# Patient Record
Sex: Female | Born: 1970 | Race: Black or African American | Hispanic: No | Marital: Single | Smoking: Never smoker
Health system: Southern US, Community
[De-identification: ages and names within clinical notes are randomized; demographics above are authoritative.]

## PROBLEM LIST (undated history)

## (undated) DIAGNOSIS — Z789 Other specified health status: Secondary | ICD-10-CM

## (undated) DIAGNOSIS — T4145XA Adverse effect of unspecified anesthetic, initial encounter: Secondary | ICD-10-CM

## (undated) DIAGNOSIS — Z8742 Personal history of other diseases of the female genital tract: Secondary | ICD-10-CM

## (undated) DIAGNOSIS — N83209 Unspecified ovarian cyst, unspecified side: Secondary | ICD-10-CM

## (undated) DIAGNOSIS — T8859XA Other complications of anesthesia, initial encounter: Secondary | ICD-10-CM

## (undated) HISTORY — DX: Unspecified ovarian cyst, unspecified side: N83.209

## (undated) HISTORY — PX: DILATION AND CURETTAGE OF UTERUS: SHX78

## (undated) HISTORY — PX: WISDOM TOOTH EXTRACTION: SHX21

## (undated) HISTORY — DX: Personal history of other diseases of the female genital tract: Z87.42

## (undated) HISTORY — PX: OVARIAN CYST SURGERY: SHX726

---

## 2008-02-01 DIAGNOSIS — E282 Polycystic ovarian syndrome: Secondary | ICD-10-CM | POA: Insufficient documentation

## 2012-02-11 ENCOUNTER — Encounter: Payer: BC Managed Care – PPO | Admitting: Obstetrics and Gynecology

## 2012-02-24 ENCOUNTER — Ambulatory Visit: Payer: BC Managed Care – PPO | Admitting: Obstetrics and Gynecology

## 2012-02-24 ENCOUNTER — Encounter: Payer: Self-pay | Admitting: Obstetrics and Gynecology

## 2012-02-24 VITALS — BP 122/82 | Ht 65.0 in | Wt 197.0 lb

## 2012-02-24 DIAGNOSIS — N926 Irregular menstruation, unspecified: Secondary | ICD-10-CM

## 2012-02-24 DIAGNOSIS — R5383 Other fatigue: Secondary | ICD-10-CM

## 2012-02-24 LAB — LIPID PANEL
HDL: 57 mg/dL (ref >39)
LDL Cholesterol: 130 mg/dL — ABNORMAL HIGH (ref 0–99)
Total CHOL/HDL Ratio: 3.5 Ratio
Triglycerides: 67 mg/dL (ref <150)
VLDL: 13 mg/dL (ref 0–40)

## 2012-02-24 LAB — CBC
HCT: 36.5 % (ref 36.0–46.0)
MCHC: 31.2 g/dL (ref 30.0–36.0)
MCV: 70.7 fL — ABNORMAL LOW (ref 78.0–100.0)
RDW: 17.2 % — ABNORMAL HIGH (ref 11.5–15.5)

## 2012-02-24 LAB — COMPREHENSIVE METABOLIC PANEL
Alkaline Phosphatase: 69 U/L (ref 39–117)
BUN: 17 mg/dL (ref 6–23)
Creat: 0.73 mg/dL (ref 0.50–1.10)
Glucose, Bld: 75 mg/dL (ref 70–99)
Total Bilirubin: 0.7 mg/dL (ref 0.3–1.2)

## 2012-02-24 NOTE — Progress Notes (Signed)
NEW GYNECOLOGIC EXAMINATION  [name] is an 41 y.o. female, No obstetric history on file., who presents to the [address] division of [facility] for a new patient gynecologic examination.   Leonard Schwartz, M.D. 02/24/2012

## 2012-02-24 NOTE — Progress Notes (Signed)
NEW GYNECOLOGIC EXAMINATION  [name] is an 41 y.o. female, G0P0, who presents to the [facility] division of [facility] for a new patient gynecologic examination. She has a history of polycystic ovary syndrome.  She has a history of endometrial polyps.  She has had a dilatation and curettage on 3 occasions.  She is not sexually active.  She does want to have children in the future.  She has a partner.  She has intermittent bleeding.    Pertinent Gynecological History: Patient's last menstrual period was 01/31/2012. Menses: intermittent Menarche: age 69 Contraception: abstinence DES exposure: unknown Blood transfusions: none Sexually transmitted diseases: The patient denies history of sexually transmitted disease. Previous GYN Procedures: DNC  Last mammogram: not applicable Date: not applicable Last pap: normal  History of Abnormal Pap Smears:  No   Obstetrical History:  Vaginal Deliveries at Term:      0 Preterm Vaginal Deliveries:      0 Cesarean Deliveries at Term:  0 Preterm Cesareans:                 0 Miscarriages:                            0 Abortions:                                  0    Past Medical History  Diagnosis Date  . Hypertension   . Ovarian cyst   . History of PCOS     Past Surgical History  Procedure Date  . Ovarian cyst surgery   . Wisdom tooth extraction     Family History  Problem Relation Age of Onset  . Diabetes Father   . Heart Problems Father   . Parkinson's disease Mother   . Diabetes Mother   . Asthma Mother     Social History:  reports that she has never smoked. She has never used smokeless tobacco. She reports that she does not drink alcohol or use illicit drugs.  Allergies:  Allergies  Allergen Reactions  . Wheat Bran     Pt has a wheat sensitivity     Medications: I have reviewed the patient's current medications.  Review of Systems:  See history of present illness and  gynecologic history.  Physical Examination:  Blood pressure 122/82, height 5\' 5"  (1.651 m), weight 197 lb (89.359 kg), last menstrual period 01/31/2012. Body mass index is 32.78 kg/(m^2).  General: alert and no distress Resp: clear to auscultation bilaterally Cardio: regular rate and rhythm, S1, S2 normal, no murmur, click, rub or gallop GI: soft and nontender Extremities: extremities normal, atraumatic, no cyanosis or edema  normal appearance, no masses or tenderness, deferred  External genitalia: normal general appearance Vaginal: normal without tenderness, induration or masses Cervix: normal appearance Adnexa: normal bimanual exam Uterus: normal size shape and consistency  Results for orders placed in visit on 02/24/12 (from the past 48 hour(s))  POCT URINE PREGNANCY     Status: Normal   Collection Time   02/24/12  9:38 AM      Component Value Range Comment   Preg Test, Ur Negative        Assessment:  History of uterine polyps.  Polycystic ovary syndrome  Vaginal spotting  Overweight or obese: Yes   Pelvic relaxation: No  History of low vitamin D.  History of hypertension.  Currently on no medication.   Plan:    pap smear return annually or prn Contraception:abstinence   CBC, comprehensive metabolic panel, TSH, vitamin D, lipid profile  STD screen request: No  The updated Pap smear screening guidelines were discussed with the patient. The patient requested that I obtain a Pap smear: No.  Kegel exercises discussed: No.  Proper diet and regular exercise were reviewed.  Annual mammograms recommended starting at age 32. Proper breast care was discussed.  Screening colonoscopy is recommended beginning at age 53.  Regular health maintenance was reviewed.  Sleep hygiene was discussed.  Adequate calcium and vitamin D intake was emphasized.  Medications Prescribed:  none  Return to Office:  In 1 month for annual exam  Leonard Schwartz,  M.D. 02/24/2012    When did bleeding start: 01/2012 How  Long: for a day stop and spotting between cycles  How often changing pad/tampon: every 45 min/ hour  Bleeding Disorders: no Cramping: yes Contraception: no Fibroids: no Hormone Therapy: no New Medications: no Menopausal Symptoms: no Vag. Discharge: no Abdominal Pain: no Increased Stress: yes Pt stated she has a history of pcos.

## 2012-04-15 ENCOUNTER — Ambulatory Visit (INDEPENDENT_AMBULATORY_CARE_PROVIDER_SITE_OTHER): Payer: BC Managed Care – PPO | Admitting: Obstetrics and Gynecology

## 2012-04-15 ENCOUNTER — Encounter: Payer: Self-pay | Admitting: Obstetrics and Gynecology

## 2012-04-15 VITALS — BP 140/90 | Resp 18 | Ht 65.0 in | Wt 198.0 lb

## 2012-04-15 DIAGNOSIS — Z124 Encounter for screening for malignant neoplasm of cervix: Secondary | ICD-10-CM

## 2012-04-15 DIAGNOSIS — Z01419 Encounter for gynecological examination (general) (routine) without abnormal findings: Secondary | ICD-10-CM

## 2012-04-15 NOTE — Progress Notes (Signed)
ANNUAL GYNECOLOGIC EXAMINATION   Krystal Bauer is a 42 y.o. female, G0P0, who presents for an annual exam. The patient has a history of polycystic ovary syndrome. She is not sexually active. She does plan to marry. She wants to have children. Blood tests were performed that showed a LDL cholesterol of 130. Her hemoglobin was 11.4. She has a history of hypertension.  History   Social History  . Marital Status: Single    Spouse Name: N/A    Number of Children: N/A  . Years of Education: N/A   Social History Main Topics  . Smoking status: Never Smoker   . Smokeless tobacco: Never Used  . Alcohol Use: Yes     Comment: socially   . Drug Use: No  . Sexually Active: Not Currently    Birth Control/ Protection: Abstinence   Other Topics Concern  . None   Social History Narrative  . None    Menstrual cycle:   LMP: Patient's last menstrual period was 03/30/2012.             The following portions of the patient's history were reviewed and updated as appropriate: allergies, current medications, past family history, past medical history, past social history, past surgical history and problem list.  Review of Systems Pertinent items are noted in HPI. Breast:Negative for breast lump,nipple discharge or nipple retraction Gastrointestinal: Negative for abdominal pain, change in bowel habits or rectal bleeding Urinary:negative   Objective:    BP 140/90  Resp 18  Ht 5\' 5"  (1.651 m)  Wt 198 lb (89.812 kg)  BMI 32.95 kg/m2  LMP 03/30/2012    Weight:  Wt Readings from Last 1 Encounters:  04/15/12 198 lb (89.812 kg)          BMI: Body mass index is 32.95 kg/(m^2).  General Appearance: Alert, appropriate appearance for age. No acute distress HEENT: Grossly normal Neck / Thyroid: Supple, no masses, nodes or enlargement Lungs: clear to auscultation bilaterally Back: No CVA tenderness Breast Exam: No masses or nodes.No dimpling, nipple retraction or discharge. Cardiovascular: Regular  rate and rhythm. S1, S2, no murmur Gastrointestinal: Soft, non-tender, no masses or organomegaly  ++++++++++++++++++++++++++++++++++++++++++++++++++++++++  Pelvic Exam: External genitalia: normal general appearance Vaginal: normal without tenderness, induration or masses. Relaxation: No. The patient is very anxious and it is difficult to do a good pelvic exam. Cervix: normal appearance Adnexa: normal bimanual exam Uterus: normal size, shape, and consistency Rectovaginal: normal rectal, no masses  ++++++++++++++++++++++++++++++++++++++++++++++++++++++++  Lymphatic Exam: Non-palpable nodes in neck, clavicular, axillary, or inguinal regions Neurologic: Normal speech, no tremor  Psychiatric: Alert and oriented, appropriate affect.  Assessment:    Normal gyn exam   Overweight or obese: Yes   Pelvic relaxation: No  Hypertension  Polycystic ovary syndrome  Elevated LDL cholesterol  Anemia   Plan:    mammogram pap smear return annually or prn Contraception:abstinence  Iron therapy discussed.  Preconception issues reviewed.  Medications prescribed: none  STD screen request: No   The updated Pap smear screening guidelines were discussed with the patient. The patient requested that I obtain a Pap smear: Yes.  Kegel exercises discussed: No.  Proper diet and regular exercise were reviewed.  Annual mammograms recommended starting at age 62. Proper breast care was discussed.  Screening colonoscopy is recommended beginning at age 35.  Regular health maintenance was reviewed.  Sleep hygiene was discussed.  Adequate calcium and vitamin D intake was emphasized.  Leonard Schwartz M.D.    Regular Periods: yes Mammogram:  last year  Monthly Breast Ex.: no Exercise: no  Tetanus < 10 years: no Seatbelts: yes  NI. Bladder Functn.: yes Abuse at home: no  Daily BM's: yes Stressful Work: no  Healthy Diet: yes Sigmoid-Colonoscopy: Never  Calcium: no Medical problems  this year: Irregular Bleeding , PCOS.    LAST [id number]   Contraception: None  Mammogram:    PCP: NO PCP in [facility]  PMH: No Changes  FMH: No Changes  Last Bone Scan: never

## 2012-04-17 LAB — PAP IG W/ RFLX HPV ASCU

## 2012-05-21 ENCOUNTER — Encounter: Payer: BC Managed Care – PPO | Admitting: Obstetrics and Gynecology

## 2012-06-01 ENCOUNTER — Telehealth: Payer: Self-pay | Admitting: Obstetrics and Gynecology

## 2012-06-01 NOTE — Telephone Encounter (Signed)
lvm for pt to return call   Nayellie Sanseverino, CMA  

## 2012-06-16 ENCOUNTER — Encounter: Payer: Self-pay | Admitting: Obstetrics and Gynecology

## 2012-06-16 ENCOUNTER — Ambulatory Visit: Payer: BC Managed Care – PPO | Admitting: Obstetrics and Gynecology

## 2012-06-16 VITALS — BP 126/88 | Wt 197.0 lb

## 2012-06-16 DIAGNOSIS — N926 Irregular menstruation, unspecified: Secondary | ICD-10-CM

## 2012-06-16 NOTE — Progress Notes (Signed)
When did bleeding start: 06/01/12 How  Long: 7 days How often changing pad/tampon: every hour  Bleeding Disorders: no Cramping: yes Contraception: no  Fibroids: no Hormone Therapy: no New Medications: no Menopausal Symptoms: no Vag. Discharge: no Abdominal Pain: no Increased Stress: no Hx of uterine polyps  BP 126/88  Wt 197 lb (89.359 kg)  BMI 32.78 kg/m2  LMP 06/01/2012 Pt states she had bleeding on and off for the entire month of February.  She has a history of PCOS.  She states she has uterine polyps removed every two years.  Over the past year she has had a twenty pound weight gain  Physical Examination: General appearance - alert, well appearing, and in no distress Chest - clear to auscultation, no wheezes, rales or rhonchi, symmetric air entry Heart - normal rate and regular rhythm Abdomen - soft, nontender, nondistended, no masses or organomegaly Pelvic - normal external genitalia, vulva, vagina, cervix, uterus and adnexa, exam limited by patient.  She was very uncomfortable and states she has a hard time having pelvic exams.  She did consent to it and still remained very tense on the exam.  No vaginal bleeding seen Extremities - peripheral pulses normal, no pedal edema, no clubbing or cyanosis PCOS Irregular vaginal bleeding Pt declined UPT, pelvic and abdominal US She desirs for DR  Stefano Gaul to evaluate her bleeding with a hysteroscopy and remove polyps if they are present.  I will report this to Dr Stefano Gaul

## 2012-06-16 NOTE — Patient Instructions (Signed)

## 2012-06-16 NOTE — Telephone Encounter (Signed)
No return call received. Pt scheduled for apt on 06/16/12  Darien Ramus, CMA

## 2012-08-04 ENCOUNTER — Other Ambulatory Visit: Payer: Self-pay | Admitting: Family Medicine

## 2012-08-04 DIAGNOSIS — Z1231 Encounter for screening mammogram for malignant neoplasm of breast: Secondary | ICD-10-CM

## 2012-11-18 ENCOUNTER — Other Ambulatory Visit: Payer: Self-pay | Admitting: Obstetrics and Gynecology

## 2012-11-26 ENCOUNTER — Encounter (HOSPITAL_COMMUNITY)
Admission: RE | Admit: 2012-11-26 | Discharge: 2012-11-26 | Disposition: A | Discharge status: Home / Self Care | Payer: BC Managed Care – PPO | Source: Ambulatory Visit | Attending: Obstetrics and Gynecology | Admitting: Obstetrics and Gynecology

## 2012-11-26 ENCOUNTER — Encounter (HOSPITAL_COMMUNITY): Payer: Self-pay

## 2012-11-26 DIAGNOSIS — Z01818 Encounter for other preprocedural examination: Secondary | ICD-10-CM | POA: Insufficient documentation

## 2012-11-26 DIAGNOSIS — Z01812 Encounter for preprocedural laboratory examination: Secondary | ICD-10-CM | POA: Insufficient documentation

## 2012-11-26 HISTORY — DX: Adverse effect of unspecified anesthetic, initial encounter: T41.45XA

## 2012-11-26 HISTORY — DX: Other specified health status: Z78.9

## 2012-11-26 HISTORY — DX: Other complications of anesthesia, initial encounter: T88.59XA

## 2012-11-26 LAB — CBC
HCT: 34.1 % — ABNORMAL LOW (ref 36.0–46.0)
MCV: 65 fL — ABNORMAL LOW (ref 78.0–100.0)
Platelets: 244 10*3/uL (ref 150–400)
RBC: 5.25 MIL/uL — ABNORMAL HIGH (ref 3.87–5.11)
RDW: 17.8 % — ABNORMAL HIGH (ref 11.5–15.5)
WBC: 9.7 10*3/uL (ref 4.0–10.5)

## 2012-11-26 NOTE — Patient Instructions (Addendum)
20 Krystal Bauer  11/26/2012   Your procedure is scheduled on:  12/03/12  Enter through the Main Entrance of [facility] at 1PM   Pick up the phone at the desk and dial [phone].   Call this number if you have problems the morning of surgery: [phone]   Remember:   Do not eat food:After Midnight.  Do not drink clear liquids: 4 Hours before arrival.  Take these medicines the morning of surgery with A SIP OF WATER: NA   Do not wear jewelry, make-up or nail polish.  Do not wear lotions, powders, or perfumes. You may wear deodorant.  Do not shave 48 hours prior to surgery.  Do not bring valuables to the hospital.  [facility] is not responsible                  for any belongings or valuables brought to the hospital.  Contacts, dentures or bridgework may not be worn into surgery.  Leave suitcase in the car. After surgery it may be brought to your room.  For patients admitted to the hospital, checkout time is 11:00 AM the day of                discharge.   Patients discharged the day of surgery will not be allowed to drive                   home.  Name and phone number of your driver: [name]  Special Instructions: Shower using CHG 2 nights before surgery and the night before surgery.  If you shower the day of surgery use CHG.  Use special wash - you have one bottle of CHG for all showers.  You should use approximately 1/3 of the bottle for each shower.   Please read over the following fact sheets that you were given: Surgical Site Infection Prevention

## 2012-12-02 NOTE — H&P (Signed)
  Admission History and Physical Exam for a Gynecology Patient  [name] is a 42 y.o. female, G0P0, who presents for hysteroscopy with dilatation and curettage. She has been followed at the [facility] division of [facility]. The patient complains of irregular bleeding.  She has a history of polycystic ovary syndrome. She has a history of anemia. She is anxious and has difficulty tolerating pelvic exams well.  OB History   Grav Para Term Preterm Abortions TAB SAB Ect Mult Living   0               Past Medical History  Diagnosis Date  . Ovarian cyst   . History of PCOS   . Complication of anesthesia     pt feels she has prolonged effects of anes, lethargy and feels 'woozey"  . Medical history non-contributory     No prescriptions prior to admission    Past Surgical History  Procedure Laterality Date  . Ovarian cyst surgery    . Wisdom tooth extraction    . Dilation and curettage of uterus      x3    Allergies  Allergen Reactions  . Wheat Bran     Pt has a wheat sensitivity   . Sulfa Antibiotics Other (See Comments)    Pt can not remember exact reaction; possible rash    Family History: family history includes Asthma in her mother; Diabetes in her father and mother; Heart Problems in her father; Parkinson's disease in her mother.  Social History:  reports that she has never smoked. She has never used smokeless tobacco. She reports that  drinks alcohol. She reports that she does not use illicit drugs.  Review of systems: See HPI.  Admission Physical Exam:    There is no weight on file to calculate BMI.  There were no vitals taken for this visit.  HEENT:                 Within normal limits Chest:                   Clear Heart:                    Regular rate and rhythm Breasts:                No masses, skin changes, bleeding, or discharge present Abdomen:             Nontender, no masses Extremities:           Grossly normal Neurologic exam: Grossly normal  Pelvic exam:  External genitalia: normal general appearance Vaginal: normal without tenderness, induration or masses Cervix: normal appearance Adnexa: normal bimanual exam Uterus: normal size shape and consistency  Assessment:  Irregular uterine bleeding  Polycystic ovary syndrome  Anemia  Anxiety about pelvic examinations  Plan:  The patient will undergo hysteroscopy with dilatation and curettage.  She understands the indications for her procedure.  She accepts the risk of, but not limited to, anesthetic complications, bleeding, infections, and possible damage to surrounding organs.   Janine Limbo 12/02/2012

## 2012-12-03 ENCOUNTER — Encounter (HOSPITAL_COMMUNITY): Payer: Self-pay

## 2012-12-03 ENCOUNTER — Encounter (HOSPITAL_COMMUNITY)
Admission: RE | Disposition: A | Discharge status: Home / Self Care | Payer: Self-pay | Source: Ambulatory Visit | Attending: Obstetrics and Gynecology

## 2012-12-03 ENCOUNTER — Ambulatory Visit (HOSPITAL_COMMUNITY): Payer: BC Managed Care – PPO | Admitting: Anesthesiology

## 2012-12-03 ENCOUNTER — Ambulatory Visit (HOSPITAL_COMMUNITY)
Admission: RE | Admit: 2012-12-03 | Discharge: 2012-12-03 | Disposition: A | Discharge status: Home / Self Care | Payer: BC Managed Care – PPO | Source: Ambulatory Visit | Attending: Obstetrics and Gynecology | Admitting: Obstetrics and Gynecology

## 2012-12-03 ENCOUNTER — Encounter (HOSPITAL_COMMUNITY): Payer: Self-pay | Admitting: Anesthesiology

## 2012-12-03 DIAGNOSIS — N925 Other specified irregular menstruation: Secondary | ICD-10-CM | POA: Insufficient documentation

## 2012-12-03 DIAGNOSIS — E282 Polycystic ovarian syndrome: Secondary | ICD-10-CM | POA: Insufficient documentation

## 2012-12-03 DIAGNOSIS — F411 Generalized anxiety disorder: Secondary | ICD-10-CM | POA: Insufficient documentation

## 2012-12-03 DIAGNOSIS — N938 Other specified abnormal uterine and vaginal bleeding: Secondary | ICD-10-CM | POA: Insufficient documentation

## 2012-12-03 DIAGNOSIS — N949 Unspecified condition associated with female genital organs and menstrual cycle: Secondary | ICD-10-CM | POA: Insufficient documentation

## 2012-12-03 DIAGNOSIS — D500 Iron deficiency anemia secondary to blood loss (chronic): Secondary | ICD-10-CM | POA: Insufficient documentation

## 2012-12-03 DIAGNOSIS — D250 Submucous leiomyoma of uterus: Secondary | ICD-10-CM | POA: Insufficient documentation

## 2012-12-03 DIAGNOSIS — N840 Polyp of corpus uteri: Secondary | ICD-10-CM | POA: Insufficient documentation

## 2012-12-03 HISTORY — PX: DILATATION & CURRETTAGE/HYSTEROSCOPY WITH RESECTOCOPE: SHX5572

## 2012-12-03 LAB — PREGNANCY, URINE: Preg Test, Ur: NEGATIVE

## 2012-12-03 SURGERY — DILATATION & CURETTAGE/HYSTEROSCOPY WITH RESECTOCOPE
Anesthesia: General | Site: Vagina | Wound class: Clean Contaminated

## 2012-12-03 MED ORDER — FENTANYL CITRATE 0.05 MG/ML IJ SOLN: INTRAMUSCULAR | Status: DC | PRN

## 2012-12-03 MED ORDER — MEPERIDINE HCL 25 MG/ML IJ SOLN: 6.2500 mg | INTRAMUSCULAR | Status: DC | PRN

## 2012-12-03 MED ORDER — LIDOCAINE HCL (CARDIAC) 20 MG/ML IV SOLN
INTRAVENOUS | Status: AC
  Filled 2012-12-03: qty 5

## 2012-12-03 MED ORDER — LACTATED RINGERS IV SOLN
INTRAVENOUS | Status: DC
  Administered 2012-12-03 (×2): via INTRAVENOUS

## 2012-12-03 MED ORDER — MIDAZOLAM HCL 5 MG/5ML IJ SOLN: INTRAMUSCULAR | Status: DC | PRN

## 2012-12-03 MED ORDER — BUPIVACAINE-EPINEPHRINE (PF) 0.5% -1:200000 IJ SOLN
INTRAMUSCULAR | Status: AC
  Filled 2012-12-03: qty 10

## 2012-12-03 MED ORDER — SCOPOLAMINE 1 MG/3DAYS TD PT72
1.0000 | MEDICATED_PATCH | TRANSDERMAL | Status: DC
  Administered 2012-12-03: 1.5 mg via TRANSDERMAL

## 2012-12-03 MED ORDER — ONDANSETRON HCL 4 MG/2ML IJ SOLN
INTRAMUSCULAR | Status: AC
  Filled 2012-12-03: qty 2

## 2012-12-03 MED ORDER — DEXAMETHASONE SODIUM PHOSPHATE 4 MG/ML IJ SOLN
INTRAMUSCULAR | Status: DC | PRN
  Administered 2012-12-03: 10 mg via INTRAVENOUS

## 2012-12-03 MED ORDER — SCOPOLAMINE 1 MG/3DAYS TD PT72
MEDICATED_PATCH | TRANSDERMAL | Status: AC
  Administered 2012-12-03: 1.5 mg via TRANSDERMAL
  Filled 2012-12-03: qty 1

## 2012-12-03 MED ORDER — PROPOFOL 10 MG/ML IV BOLUS
INTRAVENOUS | Status: DC | PRN
  Administered 2012-12-03: 150 mg via INTRAVENOUS
  Administered 2012-12-03: 50 mg via INTRAVENOUS

## 2012-12-03 MED ORDER — FENTANYL CITRATE 0.05 MG/ML IJ SOLN
INTRAMUSCULAR | Status: AC
  Filled 2012-12-03: qty 5

## 2012-12-03 MED ORDER — GLYCINE 1.5 % IR SOLN
Status: DC | PRN
  Administered 2012-12-03: 3000 mL

## 2012-12-03 MED ORDER — MIDAZOLAM HCL 5 MG/5ML IJ SOLN
INTRAMUSCULAR | Status: DC | PRN
  Administered 2012-12-03: 1 mg via INTRAVENOUS

## 2012-12-03 MED ORDER — DEXAMETHASONE SODIUM PHOSPHATE 10 MG/ML IJ SOLN
INTRAMUSCULAR | Status: AC
  Filled 2012-12-03: qty 1

## 2012-12-03 MED ORDER — LIDOCAINE HCL (CARDIAC) 20 MG/ML IV SOLN
INTRAVENOUS | Status: DC | PRN
  Administered 2012-12-03: 80 mg via INTRAVENOUS

## 2012-12-03 MED ORDER — OXYCODONE-ACETAMINOPHEN 5-325 MG PO TABS: 1.0000 | ORAL_TABLET | ORAL | Status: DC | PRN

## 2012-12-03 MED ORDER — MIDAZOLAM HCL 2 MG/2ML IJ SOLN
INTRAMUSCULAR | Status: AC
  Filled 2012-12-03: qty 2

## 2012-12-03 MED ORDER — FENTANYL CITRATE 0.05 MG/ML IJ SOLN: 25.0000 ug | INTRAMUSCULAR | Status: DC | PRN

## 2012-12-03 MED ORDER — LIDOCAINE HCL (CARDIAC) 20 MG/ML IV SOLN: INTRAVENOUS | Status: DC | PRN

## 2012-12-03 MED ORDER — PROPOFOL 10 MG/ML IV EMUL
INTRAVENOUS | Status: AC
  Filled 2012-12-03: qty 20

## 2012-12-03 MED ORDER — KETOROLAC TROMETHAMINE 60 MG/2ML IM SOLN
INTRAMUSCULAR | Status: DC | PRN
  Administered 2012-12-03: 30 mg via INTRAMUSCULAR

## 2012-12-03 MED ORDER — KETOROLAC TROMETHAMINE 30 MG/ML IJ SOLN
INTRAMUSCULAR | Status: AC
  Filled 2012-12-03: qty 2

## 2012-12-03 MED ORDER — FENTANYL CITRATE 0.05 MG/ML IJ SOLN
INTRAMUSCULAR | Status: DC | PRN
  Administered 2012-12-03 (×5): 50 ug via INTRAVENOUS

## 2012-12-03 MED ORDER — ONDANSETRON HCL 4 MG/2ML IJ SOLN
INTRAMUSCULAR | Status: DC | PRN
  Administered 2012-12-03: 4 mg via INTRAVENOUS

## 2012-12-03 MED ORDER — BUPIVACAINE-EPINEPHRINE 0.5% -1:200000 IJ SOLN
INTRAMUSCULAR | Status: DC | PRN
  Administered 2012-12-03: 10 mL

## 2012-12-03 MED ORDER — KETOROLAC TROMETHAMINE 30 MG/ML IJ SOLN
INTRAMUSCULAR | Status: DC | PRN
  Administered 2012-12-03: 30 mg via INTRAVENOUS

## 2012-12-03 MED ORDER — METOCLOPRAMIDE HCL 5 MG/ML IJ SOLN: 10.0000 mg | Freq: Once | INTRAMUSCULAR | Status: DC | PRN

## 2012-12-03 MED ORDER — PROMETHAZINE HCL 12.5 MG PO TABS: 12.5000 mg | ORAL_TABLET | Freq: Four times a day (QID) | ORAL | Status: DC | PRN

## 2012-12-03 MED ORDER — IBUPROFEN 800 MG PO TABS: 800.0000 mg | ORAL_TABLET | Freq: Three times a day (TID) | ORAL | Status: DC | PRN

## 2012-12-03 SURGICAL SUPPLY — 16 items
CANISTER SUCTION 2500CC (MISCELLANEOUS) ×2 IMPLANT
CATH ROBINSON RED A/P 16FR (CATHETERS) ×2 IMPLANT
CLOTH BEACON ORANGE TIMEOUT ST (SAFETY) ×2 IMPLANT
CONTAINER PREFILL 10% NBF 60ML (FORM) ×6 IMPLANT
DRESSING TELFA 8X3 (GAUZE/BANDAGES/DRESSINGS) ×2 IMPLANT
ELECT REM PT RETURN 9FT ADLT (ELECTROSURGICAL) ×2
ELECTRODE REM PT RTRN 9FT ADLT (ELECTROSURGICAL) ×1 IMPLANT
GLOVE BIOGEL PI IND STRL 8.5 (GLOVE) ×1 IMPLANT
GLOVE BIOGEL PI INDICATOR 8.5 (GLOVE) ×1
GLOVE ECLIPSE 8.0 STRL XLNG CF (GLOVE) ×4 IMPLANT
GOWN STRL REIN XL XLG (GOWN DISPOSABLE) ×4 IMPLANT
LOOP ANGLED CUTTING 22FR (CUTTING LOOP) ×2 IMPLANT
PACK HYSTEROSCOPY LF (CUSTOM PROCEDURE TRAY) ×2 IMPLANT
PAD OB MATERNITY 4.3X12.25 (PERSONAL CARE ITEMS) ×2 IMPLANT
TOWEL OR 17X24 6PK STRL BLUE (TOWEL DISPOSABLE) ×4 IMPLANT
WATER STERILE IRR 1000ML POUR (IV SOLUTION) ×2 IMPLANT

## 2012-12-03 NOTE — Anesthesia Procedure Notes (Signed)
Procedure Name: LMA Insertion Date/Time: 12/03/2012 2:25 PM Performed by: Graciela Husbands Pre-anesthesia Checklist: Patient identified, Timeout performed, Emergency Drugs available, Patient being monitored and Suction available Patient Re-evaluated:Patient Re-evaluated prior to inductionOxygen Delivery Method: Circle system utilized Preoxygenation: Pre-oxygenation with 100% oxygen Intubation Type: IV induction LMA: LMA inserted LMA Size: 4.0 Number of attempts: 1 Placement Confirmation: positive ETCO2 and breath sounds checked- equal and bilateral Tube secured with: Tape Dental Injury: Teeth and Oropharynx as per pre-operative assessment

## 2012-12-03 NOTE — H&P (Signed)
The patient was interviewed and examined today.  The previously documented history and physical examination was reviewed. There are no changes. The operative procedure was reviewed. The risks and benefits were outlined again. The specific risks include, but are not limited to, anesthetic complications, bleeding, infections, and possible damage to the surrounding organs. The patient's questions were answered.  We are ready to proceed as outlined. The likelihood of the patient achieving the goals of this procedure is very likely.   CBC    Component Value Date/Time   WBC 9.7 11/26/2012 1100   RBC 5.25* 11/26/2012 1100   HGB 10.5* 11/26/2012 1100   HCT 34.1* 11/26/2012 1100   PLT 244 11/26/2012 1100   MCV 65.0* 11/26/2012 1100   MCH 20.0* 11/26/2012 1100   MCHC 30.8 11/26/2012 1100   RDW 17.8* 11/26/2012 1100    BP 124/97  Pulse 71  Temp(Src) 98.2 F (36.8 C) (Oral)  Resp 16  SpO2 99%   Leonard Schwartz, M.D.

## 2012-12-03 NOTE — Anesthesia Postprocedure Evaluation (Signed)
  Anesthesia Post-op Note  Patient: Krystal Bauer  Procedure(s) Performed: Procedure(s): DILATATION & CURETTAGE/HYSTEROSCOPY WITH RESECTION OF ENDOMETRIAL POLYP and fibroid (N/A)  Patient Location: PACU  Anesthesia Type:General  Level of Consciousness: awake, alert  and oriented  Airway and Oxygen Therapy: Patient Spontanous Breathing  Post-op Pain: none  Post-op Assessment: Post-op Vital signs reviewed, Patient's Cardiovascular Status Stable, Respiratory Function Stable, Patent Airway, No signs of Nausea or vomiting and Pain level controlled  Post-op Vital Signs: Reviewed and stable  Complications: No apparent anesthesia complications

## 2012-12-03 NOTE — Anesthesia Preprocedure Evaluation (Signed)
Anesthesia Evaluation  Patient identified by MRN, date of birth, ID band Patient awake    Reviewed: Allergy & Precautions, H&P , NPO status , Patient's Chart, lab work & pertinent test results  History of Anesthesia Complications (+) PONV and PROLONGED EMERGENCE  Airway Mallampati: II TM Distance: >3 FB Neck ROM: Full    Dental no notable dental hx. (+) Teeth Intact   Pulmonary neg pulmonary ROS,  breath sounds clear to auscultation  Pulmonary exam normal       Cardiovascular negative cardio ROS  Rhythm:Regular Rate:Normal     Neuro/Psych negative neurological ROS  negative psych ROS   GI/Hepatic negative GI ROS, Neg liver ROS,   Endo/Other  negative endocrine ROS  Renal/GU negative Renal ROS  negative genitourinary   Musculoskeletal negative musculoskeletal ROS (+)   Abdominal Normal abdominal exam  (+)   Peds  Hematology negative hematology ROS (+)   Anesthesia Other Findings Permanent retainers upper and lower Chipped veneer  Reproductive/Obstetrics Menorrhagia Irregular menses                           Anesthesia Physical Anesthesia Plan  ASA: I  Anesthesia Plan: General   Post-op Pain Management:    Induction: Intravenous  Airway Management Planned: LMA  Additional Equipment:   Intra-op Plan:   Post-operative Plan: Extubation in OR  Informed Consent: I have reviewed the patients History and Physical, chart, labs and discussed the procedure including the risks, benefits and alternatives for the proposed anesthesia with the patient or authorized representative who has indicated his/her understanding and acceptance.   Dental advisory given  Plan Discussed with: CRNA, Anesthesiologist and Surgeon  Anesthesia Plan Comments:         Anesthesia Quick Evaluation

## 2012-12-03 NOTE — Transfer of Care (Signed)
Immediate Anesthesia Transfer of Care Note  Patient: Krystal Bauer  Procedure(s) Performed: Procedure(s): DILATATION & CURETTAGE/HYSTEROSCOPY WITH RESECTION OF ENDOMETRIAL POLYP and fibroid (N/A)  Patient Location: PACU  Anesthesia Type:General  Level of Consciousness: awake  Airway & Oxygen Therapy: Patient Spontanous Breathing and Patient connected to nasal cannula oxygen  Post-op Assessment: Report given to PACU RN and Post -op Vital signs reviewed and stable  Post vital signs: stable  Complications: No apparent anesthesia complications

## 2012-12-03 NOTE — Op Note (Addendum)
OPERATIVE NOTE  Krystal Bauer  DOB:    1971/03/01  MRN:    [id number]  CSN:    [id number]  Date of Surgery:  12/03/2012  Preoperative Diagnosis:  Irregular uterine bleeding  Anxiety about examinations  Anemia  Postoperative Diagnosis:  Irregular uterine bleeding  Anxiety about examinations  Anemia  Endometrial polyp  Submucosal fibroids  Procedure:  Hysteroscopy with resection of endometrial polyps and a submucosal fibroids Dilatation and curettage  Surgeon:  Leonard Schwartz, M.D.  Assistant:  None  Anesthetic:  General  Disposition:  The patient is a 42 y.o.-year-old female who presents with irregular uterine bleeding. She is anxious and has difficulty tolerating pelvic exams. She has a history of anemia and her preoperative hemoglobin is 10.5. She understands the indications for her surgical procedure. She accepts the risk of, but not limited to, anesthetic complications, bleeding, infections, and possible damage to the surrounding organs.  Findings:  On examination under anesthesia the uterus was upper limits normal size. No adnexal masses were appreciated. No parametrial disease was appreciated. The uterus sounded to 9 cm. The patient was noted to have a 1 cm endometrial polyp. She also had 2 submucosal fibroids with the largest measuring 0.5 cm in size.  Procedure:  The patient was taken to the operating room where a general anesthetic was given. The perineum and vagina were prepped with Betadine. The bladder was drained of urine. The patient was sterilely draped. Examination under anesthesia was performed. A paracervical block was placed using 10 cc of half percent Marcaine with epinephrine. An endocervical curettage was performed. The cervix was gently dilated. The diagnostic hysteroscope was inserted and the cavity was carefully inspected. Pictures were taken. Findings included: A 1 cm endometrial polyp and 2 submucosal fibroids with the largest  measuring 0.5 cm in size. Both tubal ostia appeared normal. The diagnostic hysteroscope was removed. The cervix was dilated further. The operative hysteroscope was inserted. The polyp and submucosal fibroids were resected using a single loop. The cavity was then curetted using a sharp curet. The cavity was felt to be clean at the end of our procedure. Hemostasis was adequate. All instruments were removed. The examination was repeated and the uterus was noted to be firm. Sponge, and needle counts were correct. The estimated blood loss for the procedure was 25 cc. The estimated fluid deficit loss 105 cc. The patient was awakened from her anesthetic without difficulty. She was returned to the supine position and and transported to the recovery room in stable condition. The endocervical curettings, endometrial resections, and endometrial curettings were sent to pathology.  Followup instructions:  The patient will return to see Dr. Stefano Gaul in 2 weeks. She was given a copy of the postoperative instructions for patients who've undergone hysteroscopy.  Discharge medications:  Motrin 800 mg every 8 hours as needed for mild to moderate pain. Percocet one tablet every 4 hours as needed for severe pain. Phenergan 12.5 mg every 6 hours as needed for nausea.  Leonard Schwartz, M.D.

## 2012-12-04 ENCOUNTER — Encounter (HOSPITAL_COMMUNITY): Payer: Self-pay | Admitting: Obstetrics and Gynecology

## 2013-07-26 ENCOUNTER — Other Ambulatory Visit: Payer: Self-pay | Admitting: Obstetrics and Gynecology

## 2013-07-26 DIAGNOSIS — Z1231 Encounter for screening mammogram for malignant neoplasm of breast: Secondary | ICD-10-CM

## 2013-08-03 ENCOUNTER — Ambulatory Visit: Payer: BC Managed Care – PPO

## 2013-08-04 ENCOUNTER — Encounter (INDEPENDENT_AMBULATORY_CARE_PROVIDER_SITE_OTHER): Payer: Self-pay

## 2013-08-04 ENCOUNTER — Ambulatory Visit
Admission: RE | Admit: 2013-08-04 | Discharge: 2013-08-04 | Disposition: A | Discharge status: Home / Self Care | Payer: BC Managed Care – PPO | Source: Ambulatory Visit | Attending: Obstetrics and Gynecology | Admitting: Obstetrics and Gynecology

## 2013-08-04 DIAGNOSIS — Z1231 Encounter for screening mammogram for malignant neoplasm of breast: Secondary | ICD-10-CM

## 2014-02-24 ENCOUNTER — Ambulatory Visit (INDEPENDENT_AMBULATORY_CARE_PROVIDER_SITE_OTHER): Payer: BC Managed Care – PPO | Admitting: Family Medicine

## 2014-02-24 ENCOUNTER — Encounter: Payer: Self-pay | Admitting: Family Medicine

## 2014-02-24 VITALS — BP 98/72 | HR 59 | Ht 65.0 in | Wt 204.0 lb

## 2014-02-24 DIAGNOSIS — S134XXA Sprain of ligaments of cervical spine, initial encounter: Secondary | ICD-10-CM | POA: Diagnosis not present

## 2014-02-24 MED ORDER — CYCLOBENZAPRINE HCL 10 MG PO TABS: 10.0000 mg | ORAL_TABLET | Freq: Three times a day (TID) | ORAL | Status: DC | PRN
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
## 2014-02-24 NOTE — Patient Instructions (Signed)
Good to see you Heat before activity and ice after.  Working out is good.  Exercises nightly or after working out.  My medicine Duexis 3 times daily for 6 days Flexeril at night and can take during day if needed See me again in 10-14 days.

## 2014-03-11 ENCOUNTER — Encounter: Payer: Self-pay | Admitting: Family Medicine

## 2014-03-11 ENCOUNTER — Ambulatory Visit (INDEPENDENT_AMBULATORY_CARE_PROVIDER_SITE_OTHER): Payer: BC Managed Care – PPO | Admitting: Family Medicine

## 2014-03-11 VITALS — BP 122/86 | HR 70 | Ht 65.0 in | Wt 203.0 lb

## 2014-03-11 DIAGNOSIS — S134XXD Sprain of ligaments of cervical spine, subsequent encounter: Secondary | ICD-10-CM | POA: Diagnosis not present
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
## 2014-03-11 NOTE — Patient Instructions (Addendum)
It is good to see you Vitamin D 2000 IU daily Turmeric 500mg  twice daily.  Ice is still your friend.  Continue the exercises.  Start a walk-run progression: - I would like you to do line drills (try to keep foot on a line when jogging). - Initially start one minute walking than one minute running for 20 mins in the first week,   then 25 mins during the second week, then 30 mins afterwards.  Once you have reached 30 mins: - Run 2 mins, then walk 1 min. -Then run 3 mins, and walk 1 min. -Then run 4 mins, and walk 1 min. -Then run 5 mins, and walk 1 min. -Slowly build up weekly to running 30 mins nonstop.  If painful at any of the steps, back up one step.  See me agai nafter [place] and bring in 3 day food diary and we can get that extra weight off.

## 2014-03-11 NOTE — Assessment & Plan Note (Signed)
Discussed with patient again. Patient is doing better. Encourage her to continue with the conservative therapy at this time. Discussed that the medicines rather to help. We discussed other over-the-counter medications could be beneficial. Patient will try these different interventions and come back and see me again if pain is not completely resolved in 3 weeks.

## 2014-04-28 ENCOUNTER — Other Ambulatory Visit (HOSPITAL_COMMUNITY): Payer: Self-pay | Admitting: Obstetrics and Gynecology

## 2014-04-28 DIAGNOSIS — N979 Female infertility, unspecified: Secondary | ICD-10-CM

## 2014-05-02 ENCOUNTER — Ambulatory Visit (INDEPENDENT_AMBULATORY_CARE_PROVIDER_SITE_OTHER): Payer: 59 | Admitting: Internal Medicine

## 2014-05-02 ENCOUNTER — Encounter: Payer: Self-pay | Admitting: Internal Medicine

## 2014-05-02 VITALS — BP 140/82 | HR 68 | Temp 98.5°F | Resp 16 | Ht 65.0 in | Wt 205.8 lb

## 2014-05-02 DIAGNOSIS — S134XXD Sprain of ligaments of cervical spine, subsequent encounter: Secondary | ICD-10-CM

## 2014-05-02 DIAGNOSIS — E669 Obesity, unspecified: Secondary | ICD-10-CM | POA: Insufficient documentation

## 2014-05-02 DIAGNOSIS — R21 Rash and other nonspecific skin eruption: Secondary | ICD-10-CM | POA: Insufficient documentation

## 2014-05-02 MED ORDER — KETOCONAZOLE 2 % EX CREA: 1.0000 "application " | TOPICAL_CREAM | Freq: Two times a day (BID) | CUTANEOUS | Status: DC

## 2014-05-02 NOTE — Assessment & Plan Note (Signed)
Resolved at this time and will end the problem.

## 2014-05-02 NOTE — Patient Instructions (Signed)
We do not need to check any blood work today and you are up to date on shots.   Keep up the good work on trying to exercise about 3-4 times per week. Good luck with your procedure tomorrow.   Exercise to Stay Healthy Exercise helps you become and stay healthy. EXERCISE IDEAS AND TIPS Choose exercises that:  You enjoy.  Fit into your day. You do not need to exercise really hard to be healthy. You can do exercises at a slow or medium level and stay healthy. You can:  Stretch before and after working out.  Try yoga, Pilates, or tai chi.  Lift weights.  Walk fast, swim, jog, run, climb stairs, bicycle, dance, or rollerskate.  Take aerobic classes. Exercises that burn about 150 calories:  Running 1  miles in 15 minutes.  Playing volleyball for 45 to 60 minutes.  Washing and waxing a car for 45 to 60 minutes.  Playing touch football for 45 minutes.  Walking 1  miles in 35 minutes.  Pushing a stroller 1  miles in 30 minutes.  Playing basketball for 30 minutes.  Raking leaves for 30 minutes.  Bicycling 5 miles in 30 minutes.  Walking 2 miles in 30 minutes.  Dancing for 30 minutes.  Shoveling snow for 15 minutes.  Swimming laps for 20 minutes.  Walking up stairs for 15 minutes.  Bicycling 4 miles in 15 minutes.  Gardening for 30 to 45 minutes.  Jumping rope for 15 minutes.  Washing windows or floors for 45 to 60 minutes. Document Released: 04/27/2010 Document Revised: 06/17/2011 Document Reviewed: 04/27/2010 [facility] Patient Information 2015 [place], [place]. This information is not intended to replace advice given to you by your health care provider. Make sure you discuss any questions you have with your health care provider.
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
## 2014-05-02 NOTE — Progress Notes (Signed)
Pre visit review using our clinic review tool, if applicable. No additional management support is needed unless otherwise documented below in the visit note. 

## 2014-05-02 NOTE — Assessment & Plan Note (Signed)
Talked with her about exercise and diet as a way to lose weight. She is working on it and will go to the gym after our visit. Last lipid panel 2 years ago and normal so will not repeat today. She does not wish lab work as she is having quite a lot with her infertility work up.

## 2014-05-02 NOTE — Assessment & Plan Note (Signed)
Sent in rx for ketoconazole cream that she can use on her skin rash under her breasts and between.

## 2014-05-03 ENCOUNTER — Ambulatory Visit (HOSPITAL_COMMUNITY)
Admission: RE | Admit: 2014-05-03 | Discharge: 2014-05-03 | Disposition: A | Discharge status: Home / Self Care | Payer: 59 | Source: Ambulatory Visit | Attending: Obstetrics and Gynecology | Admitting: Obstetrics and Gynecology

## 2014-05-03 DIAGNOSIS — N979 Female infertility, unspecified: Secondary | ICD-10-CM | POA: Diagnosis present

## 2014-05-03 DIAGNOSIS — E282 Polycystic ovarian syndrome: Secondary | ICD-10-CM | POA: Insufficient documentation

## 2014-05-03 DIAGNOSIS — D259 Leiomyoma of uterus, unspecified: Secondary | ICD-10-CM | POA: Insufficient documentation

## 2014-05-03 MED ORDER — IOHEXOL 300 MG/ML  SOLN
20.0000 mL | Freq: Once | INTRAMUSCULAR | Status: AC | PRN
  Administered 2014-05-03: 20 mL via INTRAVENOUS

## 2014-07-02 ENCOUNTER — Emergency Department (HOSPITAL_COMMUNITY)
Admission: EM | Admit: 2014-07-02 | Discharge: 2014-07-03 | Disposition: A | Discharge status: Home / Self Care | Payer: 59 | Attending: Emergency Medicine | Admitting: Emergency Medicine

## 2014-07-02 ENCOUNTER — Encounter (HOSPITAL_COMMUNITY): Payer: Self-pay | Admitting: Emergency Medicine

## 2014-07-02 DIAGNOSIS — Z8742 Personal history of other diseases of the female genital tract: Secondary | ICD-10-CM | POA: Insufficient documentation

## 2014-07-02 DIAGNOSIS — N12 Tubulo-interstitial nephritis, not specified as acute or chronic: Secondary | ICD-10-CM | POA: Insufficient documentation

## 2014-07-02 DIAGNOSIS — Z91040 Latex allergy status: Secondary | ICD-10-CM | POA: Insufficient documentation

## 2014-07-02 DIAGNOSIS — Z79899 Other long term (current) drug therapy: Secondary | ICD-10-CM | POA: Insufficient documentation

## 2014-07-02 DIAGNOSIS — M791 Myalgia: Secondary | ICD-10-CM | POA: Diagnosis present

## 2014-07-02 DIAGNOSIS — R000 Tachycardia, unspecified: Secondary | ICD-10-CM | POA: Insufficient documentation

## 2014-07-02 LAB — CBC WITH DIFFERENTIAL/PLATELET
BASOS PCT: 0 % (ref 0–1)
Basophils Absolute: 0 10*3/uL (ref 0.0–0.1)
EOS ABS: 0 10*3/uL (ref 0.0–0.7)
Eosinophils Relative: 0 % (ref 0–5)
HCT: 36.6 % (ref 36.0–46.0)
Hemoglobin: 11.8 g/dL — ABNORMAL LOW (ref 12.0–15.0)
Lymphocytes Relative: 6 % — ABNORMAL LOW (ref 12–46)
Lymphs Abs: 1 10*3/uL (ref 0.7–4.0)
MCH: 23 pg — AB (ref 26.0–34.0)
MCHC: 32.2 g/dL (ref 30.0–36.0)
MCV: 71.5 fL — ABNORMAL LOW (ref 78.0–100.0)
MONOS PCT: 15 % — AB (ref 3–12)
Monocytes Absolute: 2.4 10*3/uL — ABNORMAL HIGH (ref 0.1–1.0)
Neutro Abs: 13.2 10*3/uL — ABNORMAL HIGH (ref 1.7–7.7)
Neutrophils Relative %: 79 % — ABNORMAL HIGH (ref 43–77)
Platelets: 221 10*3/uL (ref 150–400)
RBC: 5.12 MIL/uL — ABNORMAL HIGH (ref 3.87–5.11)
RDW: 16.2 % — ABNORMAL HIGH (ref 11.5–15.5)
WBC: 16.6 10*3/uL — ABNORMAL HIGH (ref 4.0–10.5)

## 2014-07-02 MED ORDER — SODIUM CHLORIDE 0.9 % IV BOLUS (SEPSIS)
1000.0000 mL | Freq: Once | INTRAVENOUS | Status: AC
  Administered 2014-07-02: 1000 mL via INTRAVENOUS

## 2014-07-02 MED ORDER — ACETAMINOPHEN 325 MG PO TABS
ORAL_TABLET | ORAL | Status: AC
  Filled 2014-07-02: qty 1

## 2014-07-02 MED ORDER — ACETAMINOPHEN 325 MG PO TABS
650.0000 mg | ORAL_TABLET | Freq: Four times a day (QID) | ORAL | Status: DC | PRN
  Administered 2014-07-02: 650 mg via ORAL

## 2014-07-02 NOTE — ED Notes (Addendum)
Pt was seen at an urgent care yesterday for evaluation sinus pressure, body aches, and pain with urination.  Pt was prescribed Cipro for UTI- after taking 3 doses of medication pt reports N/V and continued body aches and chills.  Pt also reports having a headache for the past few days, neuro exam normal- pt alert and oriented X 4- denies changes in vision.

## 2014-07-02 NOTE — ED Provider Notes (Signed)
CSN: [id number]     Arrival date & time 07/02/14  2055 History  This chart was scribed for Everlene Balls, MD by Eustaquio Maize, ED Scribe. This patient was seen in room B16C/B16C and the patient's care was started at 11:08 PM.    Chief Complaint  Patient presents with  . Generalized Body Aches   The history is provided by the patient. No language interpreter was used.     HPI Comments: Krystal Bauer is a 44 y.o. female who presents to the Emergency Department complaining of generalized body aches that began 5 days ago. Pt also complains of a headache that she describes as a piercing sensation. She has also been having chills and diaphoresis. Pt has been taking Advil Cold & Sinus without any relief. She went to an Urgent Care in [place] 1 day ago and was diagnosed with a UTI. Pt was prescribed Cipro but she states that her symptoms have gotten worse since. Pt states that she has also been nauseous since beginning the Cipro. Pt reports that she is still having dysuria. She denies irregular vaginal bleeding, vaginal discharge, or any other symptoms.     Past Medical History  Diagnosis Date  . Ovarian cyst   . History of PCOS   . Complication of anesthesia     pt feels she has prolonged effects of anes, lethargy and feels 'woozey"  . Medical history non-contributory    Past Surgical History  Procedure Laterality Date  . Ovarian cyst surgery    . Wisdom tooth extraction    . Dilation and curettage of uterus      x3  . Dilatation & currettage/hysteroscopy with resectocope N/A 12/03/2012    Procedure: DILATATION & CURETTAGE/HYSTEROSCOPY WITH RESECTION OF ENDOMETRIAL POLYP and fibroid;  Surgeon: Ena Dawley, MD;  Location: [facility];  Service: Gynecology;  Laterality: N/A;   Family History  Problem Relation Age of Onset  . Diabetes Father   . Heart Problems Father   . Parkinson's disease Mother   . Diabetes Mother   . Asthma Mother    History  Substance Use Topics  .  Smoking status: Never Smoker   . Smokeless tobacco: Never Used  . Alcohol Use: Yes     Comment: socially    OB History    Gravida Para Term Preterm AB TAB SAB Ectopic Multiple Living   0              Review of Systems  10 Systems reviewed and all are negative for acute change except as noted in the HPI.    Allergies  Wheat bran; Latex; Sulfa antibiotics; and Sulfamethoxazole-trimethoprim  Home Medications   Prior to Admission medications   Medication Sig Start Date End Date Taking? Authorizing Provider  ciprofloxacin (CIPRO) 500 MG tablet Take 500 mg by mouth 2 (two) times daily. 7 day course started 07/01/14   Yes Historical Provider, MD  CRANBERRY PO Take 2 tablets by mouth daily.   Yes Historical Provider, MD  ibuprofen (ADVIL,MOTRIN) 200 MG tablet Take 600 mg by mouth every 6 (six) hours as needed (pain).   Yes Historical Provider, MD  ibuprofen (ADVIL,MOTRIN) 800 MG tablet Take 1 tablet (800 mg total) by mouth every 8 (eight) hours as needed for pain. Patient not taking: Reported on 07/02/2014 12/03/12   Ena Dawley, MD  ketoconazole (NIZORAL) 2 % cream Apply 1 application topically 2 (two) times daily. Patient not taking: Reported on 07/02/2014 05/02/14   Olga Millers, MD  Triage Vitals: BP 150/92 mmHg  Pulse 107  Temp(Src) 101.1 F (38.4 C)  Resp 17  Ht 5\' 5"  (1.651 m)  Wt 206 lb (93.441 kg)  BMI 34.28 kg/m2  SpO2 99%   Physical Exam  Constitutional: She is oriented to person, place, and time. She appears well-developed and well-nourished. No distress.  HENT:  Head: Normocephalic and atraumatic.  Nose: Nose normal.  Mouth/Throat: Oropharynx is clear and moist. No oropharyngeal exudate.  Eyes: Conjunctivae and EOM are normal. Pupils are equal, round, and reactive to light. No scleral icterus.  Neck: Normal range of motion. Neck supple. No JVD present. No tracheal deviation present. No thyromegaly present.  Cardiovascular: Regular rhythm and normal heart  sounds.  Tachycardia present.  Exam reveals no gallop and no friction rub.   No murmur heard. Pulmonary/Chest: Effort normal and breath sounds normal. No respiratory distress. She has no wheezes. She exhibits no tenderness.  Abdominal: Soft. Bowel sounds are normal. She exhibits no distension and no mass. There is tenderness (Minimal suprapubic tenderness to deep palpation. ). There is no rebound and no guarding.  Musculoskeletal: Normal range of motion. She exhibits no edema or tenderness.  Lymphadenopathy:    She has no cervical adenopathy.  Neurological: She is alert and oriented to person, place, and time. No cranial nerve deficit. She exhibits normal muscle tone.  Skin: Skin is warm and dry. No rash noted. No erythema. No pallor.  Nursing note and vitals reviewed.   ED Course  Procedures (including critical care time)  DIAGNOSTIC STUDIES: Oxygen Saturation is 99% on RA, normal by my interpretation.    COORDINATION OF CARE: 11:13 PM-Discussed treatment plan which includes CBC, CMP, Lipase, UA, Urine pregnancy with pt at bedside and pt agreed to plan.   Labs Review Labs Reviewed  CBC WITH DIFFERENTIAL/PLATELET - Abnormal; Notable for the following:    WBC 16.6 (*)    RBC 5.12 (*)    Hemoglobin 11.8 (*)    MCV 71.5 (*)    MCH 23.0 (*)    RDW 16.2 (*)    Neutrophils Relative % 79 (*)    Neutro Abs 13.2 (*)    Lymphocytes Relative 6 (*)    Monocytes Relative 15 (*)    Monocytes Absolute 2.4 (*)    All other components within normal limits  COMPREHENSIVE METABOLIC PANEL - Abnormal; Notable for the following:    Sodium 132 (*)    Potassium 3.1 (*)    Glucose, Bld 132 (*)    Albumin 3.1 (*)    All other components within normal limits  URINALYSIS, ROUTINE W REFLEX MICROSCOPIC - Abnormal; Notable for the following:    Color, Urine AMBER (*)    Hgb urine dipstick LARGE (*)    Ketones, ur 15 (*)    Protein, ur 30 (*)    Leukocytes, UA MODERATE (*)    All other components  within normal limits  URINE MICROSCOPIC-ADD ON - Abnormal; Notable for the following:    Squamous Epithelial / LPF FEW (*)    Bacteria, UA FEW (*)    All other components within normal limits  LIPASE, BLOOD  POC URINE PREG, ED    Imaging Review No results found.   EKG Interpretation None      MDM   Final diagnoses:  None   Patient presents to the ED for fevers, chills, body aches and dysuria after being on cipro for 3 days for being diagnosed with UTI by urgent care.  Here  she is febrile and tachycardic.  She was given tylenol and IVF.  Will obtain labs and repeat UA for evaluation.  She has minimal pain on exam and denies any vaginal complaints.    Urinalysis reveals possible partially treated infection. Correlating with patient stating this feels like a urinary tract infection, having fever and tachycardia will treat as polynephritis. She was given a gram of ceftriaxone emergency department. After fluids and antipyretics, patient's vital signs are now within normal range. Patient is safe for discharge home with Keflex. Return precautions given, she is advised to follow-up with her primary care physician within 3 days for continued management.  I personally performed the services described in this documentation, which was scribed in my presence. The recorded information has been reviewed and is accurate.      Everlene Balls, MD 07/03/14 [phone]

## 2014-07-03 LAB — URINE MICROSCOPIC-ADD ON

## 2014-07-03 LAB — URINALYSIS, ROUTINE W REFLEX MICROSCOPIC
BILIRUBIN URINE: NEGATIVE
Glucose, UA: NEGATIVE mg/dL
Ketones, ur: 15 mg/dL — AB
Nitrite: NEGATIVE
Protein, ur: 30 mg/dL — AB
Specific Gravity, Urine: 1.012 (ref 1.005–1.030)
UROBILINOGEN UA: 1 mg/dL (ref 0.0–1.0)
pH: 6 (ref 5.0–8.0)

## 2014-07-03 LAB — COMPREHENSIVE METABOLIC PANEL
ALT: 21 U/L (ref 0–35)
AST: 25 U/L (ref 0–37)
Albumin: 3.1 g/dL — ABNORMAL LOW (ref 3.5–5.2)
Alkaline Phosphatase: 83 U/L (ref 39–117)
Anion gap: 11 (ref 5–15)
BILIRUBIN TOTAL: 0.9 mg/dL (ref 0.3–1.2)
BUN: 6 mg/dL (ref 6–23)
CHLORIDE: 99 mmol/L (ref 96–112)
CO2: 22 mmol/L (ref 19–32)
Calcium: 8.5 mg/dL (ref 8.4–10.5)
Creatinine, Ser: 0.74 mg/dL (ref 0.50–1.10)
GFR calc Af Amer: >90 mL/min (ref >90)
GFR calc non Af Amer: >90 mL/min (ref >90)
Glucose, Bld: 132 mg/dL — ABNORMAL HIGH (ref 70–99)
Potassium: 3.1 mmol/L — ABNORMAL LOW (ref 3.5–5.1)
SODIUM: 132 mmol/L — AB (ref 135–145)
TOTAL PROTEIN: 7.3 g/dL (ref 6.0–8.3)

## 2014-07-03 LAB — LIPASE, BLOOD: Lipase: 12 U/L (ref 11–59)

## 2014-07-03 MED ORDER — DEXTROSE 5 % IV SOLN
1.0000 g | Freq: Once | INTRAVENOUS | Status: AC
  Administered 2014-07-03: 1 g via INTRAVENOUS
  Filled 2014-07-03: qty 10

## 2014-07-03 MED ORDER — POTASSIUM CHLORIDE CRYS ER 20 MEQ PO TBCR
40.0000 meq | EXTENDED_RELEASE_TABLET | Freq: Once | ORAL | Status: AC
Start: 2014-07-03 — End: 2014-07-03
  Administered 2014-07-03: 40 meq via ORAL
  Filled 2014-07-03: qty 2

## 2014-07-03 MED ORDER — ONDANSETRON 4 MG PO TBDP
4.0000 mg | ORAL_TABLET | Freq: Three times a day (TID) | ORAL | Status: DC | PRN
Start: 2014-07-03 — End: 2019-07-12

## 2014-07-03 MED ORDER — CEPHALEXIN 500 MG PO CAPS: 500.0000 mg | ORAL_CAPSULE | Freq: Two times a day (BID) | ORAL | Status: DC

## 2014-07-03 NOTE — Discharge Instructions (Signed)
Pyelonephritis, Adult Krystal Bauer, your urine shows a mild infection, stop taking ciprofloxacin and begin taking the new antibiotics as prescribed. She primary care physician within 3 days for follow-up. If symptoms worsen come back to emergency department immediately. Thank you. Pyelonephritis is a kidney infection. A kidney infection can happen quickly, or it can last for a long time. HOME CARE   Take your medicine (antibiotics) as told. Finish it even if you start to feel better.  Keep all doctor visits as told.  Drink enough fluids to keep your pee (urine) clear or pale yellow.  Only take medicine as told by your doctor. GET HELP RIGHT AWAY IF:   You have a fever or lasting symptoms for more than 2-3 days.  You have a fever and your symptoms suddenly get worse.  You cannot take your medicine or drink fluids as told.  You have chills and shaking.  You feel very weak or pass out (faint).  You do not feel better after 2 days. MAKE SURE YOU:  Understand these instructions.  Will watch your condition.  Will get help right away if you are not doing well or get worse. Document Released: 05/02/2004 Document Revised: 09/24/2011 Document Reviewed: 09/12/2010 [facility] Patient Information 2015 [place], [place]. This information is not intended to replace advice given to you by your health care provider. Make sure you discuss any questions you have with your health care provider.

## 2014-07-03 NOTE — ED Notes (Signed)
Antibiotic complete.  No s/s of reaction noted.

## 2019-03-15 ENCOUNTER — Other Ambulatory Visit: Payer: Self-pay | Admitting: Obstetrics and Gynecology

## 2019-03-15 DIAGNOSIS — D250 Submucous leiomyoma of uterus: Secondary | ICD-10-CM

## 2019-04-22 ENCOUNTER — Encounter: Payer: Self-pay | Admitting: *Deleted

## 2019-04-22 ENCOUNTER — Other Ambulatory Visit: Payer: Self-pay | Admitting: Interventional Radiology

## 2019-04-22 ENCOUNTER — Ambulatory Visit
Admission: RE | Admit: 2019-04-22 | Discharge: 2019-04-22 | Disposition: A | Discharge status: Home / Self Care | Payer: 59 | Source: Ambulatory Visit | Attending: Obstetrics and Gynecology | Admitting: Obstetrics and Gynecology

## 2019-04-22 ENCOUNTER — Other Ambulatory Visit: Payer: Self-pay

## 2019-04-22 DIAGNOSIS — D250 Submucous leiomyoma of uterus: Secondary | ICD-10-CM

## 2019-04-22 HISTORY — PX: IR RADIOLOGIST EVAL & MGMT: IMG5224

## 2019-04-22 NOTE — Consult Note (Signed)
Chief Complaint: Patient was consulted remotely today (TeleHealth) for symptomatic uterine fibroids at the request of Cousins,Sheronette.    Referring Physician(s): Cousins,Sheronette  History of Present Illness: Krystal Bauer is a 49 y.o. female who was diagnosed approximately 5 years ago with uterine fibroids.  She is having worsening menorrhagia.  She was recently diagnosed as anemic and started on p.o. iron supplementation.  She also describes some fatigue.  She has a history of polycystic ovarian disease for 25 years.  She describes only occasional bulk symptoms.  No previous fibroid therapies or surgeries.  She is G0 P0 with no plans for future pregnancy, having undergone extensive infertility work-up.  She describes some hot flashes but no other significant perimenopausal symptoms.  Her  last menstrual cycle was December 26, with monthly frequency, lasting 5 to 6 days with 3 days of heavy bleeding requiring changing pads every 2 hours.  No interperiod  Bleeding.  Recent Pap smear 02/23/2019 -.  Endometrial biopsy 04/02/2019 -.  Multiple previous D&Cs for endometrial polyps, all benign.  Pelvic ultrasound 03/09/2019 demonstrated multiple uterine fibroids measuring up to 4.6 cm. Past Medical History:  Diagnosis Date  . Complication of anesthesia    pt feels she has prolonged effects of anes, lethargy and feels 'woozey"  . History of PCOS   . Medical history non-contributory   . Ovarian cyst     Past Surgical History:  Procedure Laterality Date  . DILATATION & CURRETTAGE/HYSTEROSCOPY WITH RESECTOCOPE N/A 12/03/2012   Procedure: DILATATION & CURETTAGE/HYSTEROSCOPY WITH RESECTION OF ENDOMETRIAL POLYP and fibroid;  Surgeon: Ena Dawley, MD;  Location: [facility];  Service: Gynecology;  Laterality: N/A;  . DILATION AND CURETTAGE OF UTERUS     x3  . OVARIAN CYST SURGERY    . WISDOM TOOTH EXTRACTION      Allergies: Wheat bran, Latex, Sulfa antibiotics, and  Sulfamethoxazole-trimethoprim  Medications: Prior to Admission medications   Medication Sig Start Date End Date Taking? Authorizing Provider  cephALEXin (KEFLEX) 500 MG capsule Take 1 capsule (500 mg total) by mouth 2 (two) times daily. 07/03/14   Everlene Balls, MD  CRANBERRY PO Take 2 tablets by mouth daily.    [provider]  ibuprofen (ADVIL,MOTRIN) 200 MG tablet Take 600 mg by mouth every 6 (six) hours as needed (pain).    [provider]  ibuprofen (ADVIL,MOTRIN) 800 MG tablet Take 1 tablet (800 mg total) by mouth every 8 (eight) hours as needed for pain. Patient not taking: Reported on 07/02/2014 12/03/12   Ena Dawley, MD  ketoconazole (NIZORAL) 2 % cream Apply 1 application topically 2 (two) times daily. Patient not taking: Reported on 07/02/2014 05/02/14   Hoyt Koch, MD  ondansetron (ZOFRAN ODT) 4 MG disintegrating tablet Take 1 tablet (4 mg total) by mouth every 8 (eight) hours as needed for nausea. 07/03/14   Everlene Balls, MD     Family History  Problem Relation Age of Onset  . Diabetes Father   . Heart Problems Father   . Parkinson's disease Mother   . Diabetes Mother   . Asthma Mother     Social History   Socioeconomic History  . Marital status: Single    Spouse name: Not on file  . Number of children: Not on file  . Years of education: Not on file  . Highest education level: Not on file  Occupational History  . Not on file  Tobacco Use  . Smoking status: Never Smoker  . Smokeless tobacco: Never  Used  Substance and Sexual Activity  . Alcohol use: Yes    Comment: socially   . Drug use: No  . Sexual activity: Not Currently    Birth control/protection: Abstinence  Other Topics Concern  . Not on file  Social History Narrative  . Not on file   Social Determinants of Health   Financial Resource Strain:   . Difficulty of Paying Living Expenses: Not on file  Food Insecurity:   . Worried About Charity fundraiser in the Last Year:  Not on file  . Ran Out of Food in the Last Year: Not on file  Transportation Needs:   . Lack of Transportation (Medical): Not on file  . Lack of Transportation (Non-Medical): Not on file  Physical Activity:   . Days of Exercise per Week: Not on file  . Minutes of Exercise per Session: Not on file  Stress:   . Feeling of Stress : Not on file  Social Connections:   . Frequency of Communication with Friends and Family: Not on file  . Frequency of Social Gatherings with Friends and Family: Not on file  . Attends Religious Services: Not on file  . Active Member of Clubs or Organizations: Not on file  . Attends Archivist Meetings: Not on file  . Marital Status: Not on file    ECOG Status: 1 - Symptomatic but completely ambulatory  Review of Systems  Review of Systems: A 12 point ROS discussed and pertinent positives are indicated in the HPI above.  All other systems are negative.  Physical Exam No direct physical exam was performed (except for noted visual exam findings with Video Visits).     Vital Signs: There were no vitals taken for this visit.  Imaging: No results found.  Labs:  CBC: No results for input(s): WBC, HGB, HCT, PLT in the last 8760 hours.  COAGS: No results for input(s): INR, APTT in the last 8760 hours.  BMP: No results for input(s): NA, K, CL, CO2, GLUCOSE, BUN, CALCIUM, CREATININE, GFRNONAA, GFRAA in the last 8760 hours.  Invalid input(s): CMP  LIVER FUNCTION TESTS: No results for input(s): BILITOT, AST, ALT, ALKPHOS, PROT, ALBUMIN in the last 8760 hours.  TUMOR MARKERS: No results for input(s): AFPTM, CEA, CA199, CHROMGRNA in the last 8760 hours.  Assessment and Plan:  My impression is that this patient's menorrhagia is  likely secondary to uterine fibroids. We spent the majority of the consultation discussing the pathophysiology of uterine leiomyomata, natural history, anticipated  involution post menopause, and treatment options. We  discussed myomectomy, hysterectomy, and uterine fibroid embolization. I described the technique of UFE, anticipated benefits, possible risks and complications including but not limited to bleeding, infection, vessel damage, nontarget embolization, and incomplete symptom relief. We discussed the 90% clinical success rate historically and at our experience with UFE for abnormal bleeding. We discussed the post procedure course and time course of symptom resolution. We discussed the need for continued gynecologic care.  She seemed to understand and did ask appropriate questions, which were answered.  Based on her evaluation thus far, I think she would be an appropriate candidate for uterine fibroid embolization because of her symptomatology and  uterine fibroids. To complete her evaluation and workup, I would require pelvic MRI with contrast to best determine the exact site of location of her uterine fibroids, specifically to exclude any pedunculated fibroids on a narrow stalk, as well as to exclude any unexpected pelvic pathology.   After our discussion,  the patient was motivated proceed. Accordingly, we can set up the MRI   at her convenience as an outpatient. If this looks okay, she would like to proceed with UFE.  We discussed the Covid pandemic and current inability to admit overnight for observation, hopefully which will be alleviated shortly with the vaccine.  She was not interested in day surgery with pain block and home p.o. narcotics.   Thank you for this interesting consult.  I greatly enjoyed meeting Krystal Bauer and look forward to participating in their care.  A copy of this report was sent to the requesting provider on this date.  Electronically Signed: Rickard Rhymes 04/22/2019, 10:55 AM   I spent a total of  40 Minutes   in remote  clinical consultation, greater than 50% of which was counseling/coordinating care for symptomatic uterine fibroids.    Visit type: Audio and  video (WebEx).   Alternative for in-person consultation at [facility], [place] [address] [place], [place], [place]. This visit type was conducted due to national recommendations for restrictions regarding the COVID-19 Pandemic (e.g. social distancing).  This format is felt to be most appropriate for this patient at this time.  All issues noted in this document were discussed and addressed.

## 2019-05-07 ENCOUNTER — Ambulatory Visit (HOSPITAL_COMMUNITY)
Admission: RE | Admit: 2019-05-07 | Discharge: 2019-05-07 | Disposition: A | Discharge status: Home / Self Care | Payer: 59 | Source: Ambulatory Visit | Attending: Interventional Radiology | Admitting: Interventional Radiology

## 2019-05-07 ENCOUNTER — Other Ambulatory Visit: Payer: Self-pay

## 2019-05-07 ENCOUNTER — Encounter (HOSPITAL_COMMUNITY): Payer: Self-pay

## 2019-05-07 DIAGNOSIS — D250 Submucous leiomyoma of uterus: Secondary | ICD-10-CM

## 2019-05-07 MED ORDER — GADOBUTROL 1 MMOL/ML IV SOLN: 10.0000 mL | Freq: Once | INTRAVENOUS | Status: DC | PRN

## 2019-05-17 ENCOUNTER — Other Ambulatory Visit (HOSPITAL_COMMUNITY): Payer: 59

## 2019-06-17 ENCOUNTER — Ambulatory Visit (HOSPITAL_COMMUNITY)
Admission: RE | Admit: 2019-06-17 | Discharge: 2019-06-17 | Disposition: A | Discharge status: Home / Self Care | Payer: 59 | Source: Ambulatory Visit | Attending: Interventional Radiology | Admitting: Interventional Radiology

## 2019-06-17 ENCOUNTER — Other Ambulatory Visit: Payer: Self-pay

## 2019-06-17 DIAGNOSIS — D250 Submucous leiomyoma of uterus: Secondary | ICD-10-CM | POA: Diagnosis not present

## 2019-06-17 MED ORDER — GADOBUTROL 1 MMOL/ML IV SOLN
9.0000 mL | Freq: Once | INTRAVENOUS | Status: AC | PRN
  Administered 2019-06-17: 9 mL via INTRAVENOUS

## 2019-06-21 ENCOUNTER — Other Ambulatory Visit: Payer: Self-pay | Admitting: Interventional Radiology

## 2019-06-21 DIAGNOSIS — D219 Benign neoplasm of connective and other soft tissue, unspecified: Secondary | ICD-10-CM

## 2019-06-25 ENCOUNTER — Ambulatory Visit: Payer: 59 | Attending: Internal Medicine

## 2019-06-25 DIAGNOSIS — Z23 Encounter for immunization: Secondary | ICD-10-CM

## 2019-06-25 NOTE — Progress Notes (Signed)
   Covid-19 Vaccination Clinic  Name:  Krystal Bauer    MRN: [id number] DOB: October 28, 1970  06/25/2019  Krystal Bauer was observed post Covid-19 immunization for 15 minutes without incident. She was provided with Vaccine Information Sheet and instruction to access the V-Safe system.   Krystal Bauer was instructed to call 911 with any severe reactions post vaccine: Marland Kitchen Difficulty breathing  . Swelling of face and throat  . A fast heartbeat  . A bad rash all over body  . Dizziness and weakness   Immunizations Administered    Name Date Dose VIS Date Route   Pfizer COVID-19 Vaccine 06/25/2019  1:52 PM 0.3 mL 03/19/2019 Intramuscular   Manufacturer: [place]   Lot: [id number]   [place]: [id number]

## 2019-07-08 ENCOUNTER — Other Ambulatory Visit (HOSPITAL_COMMUNITY)
Admission: RE | Admit: 2019-07-08 | Discharge: 2019-07-08 | Disposition: A | Discharge status: Home / Self Care | Payer: 59 | Source: Ambulatory Visit | Attending: Interventional Radiology | Admitting: Interventional Radiology

## 2019-07-08 DIAGNOSIS — Z01812 Encounter for preprocedural laboratory examination: Secondary | ICD-10-CM | POA: Insufficient documentation

## 2019-07-08 DIAGNOSIS — Z20822 Contact with and (suspected) exposure to covid-19: Secondary | ICD-10-CM | POA: Diagnosis not present

## 2019-07-08 LAB — SARS CORONAVIRUS 2 (TAT 6-24 HRS): SARS Coronavirus 2: NEGATIVE

## 2019-07-09 ENCOUNTER — Other Ambulatory Visit: Payer: Self-pay | Admitting: Radiology

## 2019-07-12 ENCOUNTER — Encounter (HOSPITAL_COMMUNITY): Payer: Self-pay

## 2019-07-12 ENCOUNTER — Observation Stay (HOSPITAL_COMMUNITY)
Admission: RE | Admit: 2019-07-12 | Discharge: 2019-07-13 | Disposition: A | Discharge status: Home / Self Care | Payer: 59 | Source: Ambulatory Visit | Attending: Interventional Radiology | Admitting: Interventional Radiology

## 2019-07-12 ENCOUNTER — Other Ambulatory Visit: Payer: Self-pay | Admitting: Radiology

## 2019-07-12 ENCOUNTER — Other Ambulatory Visit: Payer: Self-pay

## 2019-07-12 ENCOUNTER — Ambulatory Visit (HOSPITAL_COMMUNITY)
Admission: RE | Admit: 2019-07-12 | Discharge: 2019-07-12 | Disposition: A | Discharge status: Home / Self Care | Payer: 59 | Source: Ambulatory Visit | Attending: Interventional Radiology | Admitting: Interventional Radiology

## 2019-07-12 VITALS — BP 152/90 | HR 90 | Temp 98.9°F | Resp 18 | Ht 64.5 in | Wt 194.0 lb

## 2019-07-12 DIAGNOSIS — Z881 Allergy status to other antibiotic agents status: Secondary | ICD-10-CM | POA: Diagnosis not present

## 2019-07-12 DIAGNOSIS — D259 Leiomyoma of uterus, unspecified: Secondary | ICD-10-CM

## 2019-07-12 DIAGNOSIS — Z91040 Latex allergy status: Secondary | ICD-10-CM | POA: Insufficient documentation

## 2019-07-12 DIAGNOSIS — Z882 Allergy status to sulfonamides status: Secondary | ICD-10-CM | POA: Diagnosis not present

## 2019-07-12 DIAGNOSIS — D219 Benign neoplasm of connective and other soft tissue, unspecified: Principal | ICD-10-CM | POA: Insufficient documentation

## 2019-07-12 HISTORY — PX: IR ANGIOGRAM PELVIS SELECTIVE OR SUPRASELECTIVE: IMG661

## 2019-07-12 HISTORY — PX: IR US GUIDE VASC ACCESS RIGHT: IMG2390

## 2019-07-12 HISTORY — PX: IR EMBO TUMOR ORGAN ISCHEMIA INFARCT INC GUIDE ROADMAPPING: IMG5449

## 2019-07-12 HISTORY — PX: IR ANGIOGRAM SELECTIVE EACH ADDITIONAL VESSEL: IMG667

## 2019-07-12 LAB — HCG, SERUM, QUALITATIVE: Preg, Serum: NEGATIVE

## 2019-07-12 LAB — CBC WITH DIFFERENTIAL/PLATELET
Abs Immature Granulocytes: 0.02 10*3/uL (ref 0.00–0.07)
Basophils Absolute: 0 10*3/uL (ref 0.0–0.1)
Basophils Relative: 0 %
Eosinophils Absolute: 0.1 10*3/uL (ref 0.0–0.5)
Eosinophils Relative: 1 %
HCT: 42.2 % (ref 36.0–46.0)
Hemoglobin: 13.2 g/dL (ref 12.0–15.0)
Immature Granulocytes: 0 %
Lymphocytes Relative: 22 %
Lymphs Abs: 1.6 10*3/uL (ref 0.7–4.0)
MCH: 25.3 pg — ABNORMAL LOW (ref 26.0–34.0)
MCHC: 31.3 g/dL (ref 30.0–36.0)
MCV: 81 fL (ref 80.0–100.0)
Monocytes Absolute: 0.7 10*3/uL (ref 0.1–1.0)
Monocytes Relative: 10 %
Neutro Abs: 4.8 10*3/uL (ref 1.7–7.7)
Neutrophils Relative %: 67 %
Platelets: 192 10*3/uL (ref 150–400)
RBC: 5.21 MIL/uL — ABNORMAL HIGH (ref 3.87–5.11)
RDW: 17.1 % — ABNORMAL HIGH (ref 11.5–15.5)
WBC: 7.1 10*3/uL (ref 4.0–10.5)
nRBC: 0 % (ref 0.0–0.2)

## 2019-07-12 LAB — BASIC METABOLIC PANEL
Anion gap: 11 (ref 5–15)
BUN: 16 mg/dL (ref 6–20)
CO2: 20 mmol/L — ABNORMAL LOW (ref 22–32)
Calcium: 8.7 mg/dL — ABNORMAL LOW (ref 8.9–10.3)
Chloride: 104 mmol/L (ref 98–111)
Creatinine, Ser: 0.58 mg/dL (ref 0.44–1.00)
GFR calc Af Amer: >60 mL/min (ref >60)
GFR calc non Af Amer: >60 mL/min (ref >60)
Glucose, Bld: 73 mg/dL (ref 70–99)
Potassium: 4.4 mmol/L (ref 3.5–5.1)
Sodium: 135 mmol/L (ref 135–145)

## 2019-07-12 LAB — PROTIME-INR
INR: 1 (ref 0.8–1.2)
Prothrombin Time: 12.7 seconds (ref 11.4–15.2)

## 2019-07-12 LAB — TYPE AND SCREEN
ABO/RH(D): O POS
Antibody Screen: NEGATIVE

## 2019-07-12 LAB — ABO/RH: ABO/RH(D): O POS

## 2019-07-12 MED ORDER — IOHEXOL 300 MG/ML  SOLN
100.0000 mL | Freq: Once | INTRAMUSCULAR | Status: AC | PRN
  Administered 2019-07-12: 20 mL via INTRA_ARTERIAL

## 2019-07-12 MED ORDER — SODIUM CHLORIDE 0.9 % IV SOLN: 250.0000 mL | INTRAVENOUS | Status: DC | PRN

## 2019-07-12 MED ORDER — ONDANSETRON HCL 4 MG/2ML IJ SOLN: 4.0000 mg | Freq: Four times a day (QID) | INTRAMUSCULAR | Status: DC | PRN

## 2019-07-12 MED ORDER — CEFAZOLIN SODIUM-DEXTROSE 2-4 GM/100ML-% IV SOLN: 2.0000 g | INTRAVENOUS | Status: AC

## 2019-07-12 MED ORDER — SODIUM CHLORIDE 0.9 % IV SOLN: INTRAVENOUS | Status: DC

## 2019-07-12 MED ORDER — FERROUS SULFATE 325 (65 FE) MG PO TABS
325.0000 mg | ORAL_TABLET | Freq: Every day | ORAL | Status: DC
  Administered 2019-07-13: 325 mg via ORAL
  Filled 2019-07-12: qty 1

## 2019-07-12 MED ORDER — SODIUM CHLORIDE 0.9% FLUSH
3.0000 mL | Freq: Two times a day (BID) | INTRAVENOUS | Status: DC
  Administered 2019-07-12: 3 mL via INTRAVENOUS

## 2019-07-12 MED ORDER — SODIUM CHLORIDE 0.9% FLUSH: 9.0000 mL | INTRAVENOUS | Status: DC | PRN

## 2019-07-12 MED ORDER — SODIUM CHLORIDE 0.9% FLUSH: 3.0000 mL | INTRAVENOUS | Status: DC | PRN

## 2019-07-12 MED ORDER — MIDAZOLAM HCL 2 MG/2ML IJ SOLN
INTRAMUSCULAR | Status: AC | PRN
  Administered 2019-07-12 (×4): 1 mg via INTRAVENOUS

## 2019-07-12 MED ORDER — LIDOCAINE HCL (PF) 1 % IJ SOLN
INTRAMUSCULAR | Status: AC | PRN
  Administered 2019-07-12: 5 mL via INTRADERMAL

## 2019-07-12 MED ORDER — HYDROMORPHONE 1 MG/ML IV SOLN
INTRAVENOUS | Status: DC
  Administered 2019-07-12: 1.7 mg via INTRAVENOUS
  Administered 2019-07-12: 30 mg via INTRAVENOUS
  Administered 2019-07-12: 0.3 mg via INTRAVENOUS
  Administered 2019-07-13: 1.3 mg via INTRAVENOUS
  Administered 2019-07-13: 1.2 mg via INTRAVENOUS
  Administered 2019-07-13: 0.9 mg via INTRAVENOUS
  Filled 2019-07-12 (×11): qty 30

## 2019-07-12 MED ORDER — MIDAZOLAM HCL 2 MG/2ML IJ SOLN
INTRAMUSCULAR | Status: AC
  Administered 2019-07-12: 2 mg
  Filled 2019-07-12: qty 6

## 2019-07-12 MED ORDER — IOHEXOL 300 MG/ML  SOLN
100.0000 mL | Freq: Once | INTRAMUSCULAR | Status: AC | PRN
  Administered 2019-07-12: 70 mL via INTRA_ARTERIAL

## 2019-07-12 MED ORDER — PROMETHAZINE HCL 25 MG PO TABS: 25.0000 mg | ORAL_TABLET | Freq: Three times a day (TID) | ORAL | Status: DC | PRN

## 2019-07-12 MED ORDER — LIDOCAINE HCL 1 % IJ SOLN
INTRAMUSCULAR | Status: AC
  Filled 2019-07-12: qty 20

## 2019-07-12 MED ORDER — KETOROLAC TROMETHAMINE 30 MG/ML IJ SOLN
30.0000 mg | INTRAMUSCULAR | Status: AC
  Administered 2019-07-12: 30 mg via INTRAVENOUS
  Filled 2019-07-12: qty 1

## 2019-07-12 MED ORDER — DIPHENHYDRAMINE HCL 12.5 MG/5ML PO ELIX
12.5000 mg | ORAL_SOLUTION | Freq: Four times a day (QID) | ORAL | Status: DC | PRN
  Filled 2019-07-12: qty 5

## 2019-07-12 MED ORDER — FENTANYL CITRATE (PF) 100 MCG/2ML IJ SOLN
INTRAMUSCULAR | Status: AC
  Filled 2019-07-12: qty 6

## 2019-07-12 MED ORDER — DIPHENHYDRAMINE HCL 50 MG/ML IJ SOLN: 12.5000 mg | Freq: Four times a day (QID) | INTRAMUSCULAR | Status: DC | PRN

## 2019-07-12 MED ORDER — HYDROCODONE-ACETAMINOPHEN 5-325 MG PO TABS
1.0000 | ORAL_TABLET | ORAL | Status: DC | PRN
  Administered 2019-07-13: 2 via ORAL
  Filled 2019-07-12: qty 2

## 2019-07-12 MED ORDER — PROMETHAZINE HCL 25 MG RE SUPP
25.0000 mg | Freq: Three times a day (TID) | RECTAL | Status: DC | PRN
  Filled 2019-07-12: qty 1

## 2019-07-12 MED ORDER — IBUPROFEN 400 MG PO TABS
800.0000 mg | ORAL_TABLET | Freq: Four times a day (QID) | ORAL | Status: DC
  Administered 2019-07-12 – 2019-07-13 (×3): 800 mg via ORAL
  Filled 2019-07-12 (×3): qty 2

## 2019-07-12 MED ORDER — FENTANYL CITRATE (PF) 100 MCG/2ML IJ SOLN
INTRAMUSCULAR | Status: AC | PRN
  Administered 2019-07-12 (×2): 50 ug via INTRAVENOUS

## 2019-07-12 MED ORDER — NALOXONE HCL 0.4 MG/ML IJ SOLN: 0.4000 mg | INTRAMUSCULAR | Status: DC | PRN

## 2019-07-12 MED ORDER — CEFAZOLIN SODIUM-DEXTROSE 2-4 GM/100ML-% IV SOLN
INTRAVENOUS | Status: AC
  Administered 2019-07-12: 2 g via INTRAVENOUS
  Filled 2019-07-12: qty 100

## 2019-07-12 MED ORDER — DOCUSATE SODIUM 100 MG PO CAPS
100.0000 mg | ORAL_CAPSULE | Freq: Two times a day (BID) | ORAL | Status: DC
  Administered 2019-07-12 – 2019-07-13 (×2): 100 mg via ORAL
  Filled 2019-07-12 (×2): qty 1

## 2019-07-12 NOTE — Progress Notes (Addendum)
Patient has elevated BP of 176/94. BP taken manually as well and BP 162/84. Orders to notify MD if SBP over 140 or DBP over 90. MD on call notified, no new orders. MD aware and due to patient's condition is find with BP. Patient is stable and currently being monitored. Dawson Bills, RN

## 2019-07-12 NOTE — Procedures (Signed)
  Procedure: Bilateral uterine artery embolization  (UFE) EBL:   minimal Complications:  none immediate  See full dictation in [employer].  Dillard Cannon MD Main # [phone] Pager  [phone]

## 2019-07-12 NOTE — Plan of Care (Signed)
  Problem: Education: Goal: Knowledge of General Education information will improve Description: Including pain rating scale, medication(s)/side effects and non-pharmacologic comfort measures 07/12/2019 1955 by Hubert Azure, RN Outcome: Progressing 07/12/2019 1955 by Hubert Azure, RN Outcome: Progressing   Problem: Health Behavior/Discharge Planning: Goal: Ability to manage health-related needs will improve 07/12/2019 1955 by Hubert Azure, RN Outcome: Progressing 07/12/2019 1955 by Hubert Azure, RN Outcome: Progressing   Problem: Clinical Measurements: Goal: Ability to maintain clinical measurements within normal limits will improve 07/12/2019 1955 by Hubert Azure, RN Outcome: Progressing 07/12/2019 1955 by Hubert Azure, RN Outcome: Progressing Goal: Will remain free from infection Outcome: Progressing

## 2019-07-12 NOTE — Progress Notes (Signed)
Patient has foley, wants foley removed and has mentioned that the nurse informed her that when she is able to ambulate that she can get foley removed. No order to remove foley, MD (Dr. Pascal Lux) on call gave verbal order to remove foley. Foley removed per verbal order at 2130. Patient is due to void. Dawson Bills, RN

## 2019-07-12 NOTE — Sedation Documentation (Signed)
T/C to [place], [place] on 3E, requesting to speak with patient's RN. RN unavailable at this time. Message left with Jarrett Soho that patient is to keep Right leg straight for 2 hours from end of UFE procedure (1520). Jarrett Soho said she will inform patient's RN.

## 2019-07-12 NOTE — H&P (Signed)
Referring Physician(s): Cousins,S  Supervising Physician: Arne [place]  Patient Status:  [facility] - In-pt  TBA  Chief Complaint: Symptomatic uterine fibroids   Subjective: Patient familiar to IR service from tele consultation with Dr. Vernard Gambles on 04/22/2019 to discuss treatment options for symptomatic uterine fibroids.  She was deemed an appropriate candidate for bilateral uterine artery embolization and presents today for the procedure.  She currently denies fever, headache, chest pain, dyspnea, cough, abdominal/back pain, nausea, vomiting, hematuria/dysuria. She does have a history of fatigue, menorrhagia.  She is anxious.  Past Medical History:  Diagnosis Date  . Complication of anesthesia    pt feels she has prolonged effects of anes, lethargy and feels 'woozey"  . History of PCOS   . Medical history non-contributory   . Ovarian cyst    Past Surgical History:  Procedure Laterality Date  . DILATATION & CURRETTAGE/HYSTEROSCOPY WITH RESECTOCOPE N/A 12/03/2012   Procedure: DILATATION & CURETTAGE/HYSTEROSCOPY WITH RESECTION OF ENDOMETRIAL POLYP and fibroid;  Surgeon: Ena Dawley, MD;  Location: [facility];  Service: Gynecology;  Laterality: N/A;  . DILATION AND CURETTAGE OF UTERUS     x3  . IR RADIOLOGIST EVAL & MGMT  04/22/2019  . OVARIAN CYST SURGERY    . WISDOM TOOTH EXTRACTION      Allergies: Wheat bran, Latex, Sulfa antibiotics, and Sulfamethoxazole-trimethoprim  Medications: Prior to Admission medications   Medication Sig Start Date End Date Taking? Authorizing Provider  cephALEXin (KEFLEX) 500 MG capsule Take 1 capsule (500 mg total) by mouth 2 (two) times daily. 07/03/14   Everlene Balls, MD  CRANBERRY PO Take 2 tablets by mouth daily.    [provider]  ibuprofen (ADVIL,MOTRIN) 200 MG tablet Take 600 mg by mouth every 6 (six) hours as needed (pain).    [provider]  ibuprofen (ADVIL,MOTRIN) 800 MG tablet Take 1 tablet (800 mg total) by mouth every  8 (eight) hours as needed for pain. Patient not taking: Reported on 07/02/2014 12/03/12   Ena Dawley, MD  ketoconazole (NIZORAL) 2 % cream Apply 1 application topically 2 (two) times daily. Patient not taking: Reported on 07/02/2014 05/02/14   Hoyt Koch, MD  ondansetron (ZOFRAN ODT) 4 MG disintegrating tablet Take 1 tablet (4 mg total) by mouth every 8 (eight) hours as needed for nausea. 07/03/14   Everlene Balls, MD     Vital Signs: Blood pressure 164/103, temp 98.5, heart rate 73, respirations 18, O2 sat 99% room air   Physical Exam awake, alert.  Chest clear to auscultation bilaterally.  Heart with regular rate and rhythm.  Abdomen soft, positive bowel sounds, nontender.  No lower extremity edema, intact distal pulses.  Imaging: No results found.  Labs:  CBC: No results for input(s): WBC, HGB, HCT, PLT in the last 8760 hours.  COAGS: No results for input(s): INR, APTT in the last 8760 hours.  BMP: No results for input(s): NA, K, CL, CO2, GLUCOSE, BUN, CALCIUM, CREATININE, GFRNONAA, GFRAA in the last 8760 hours.  Invalid input(s): CMP  LIVER FUNCTION TESTS: No results for input(s): BILITOT, AST, ALT, ALKPHOS, PROT, ALBUMIN in the last 8760 hours.  Assessment and Plan: Patient with history of symptomatic uterine fibroids, status post consultation with Dr. Vernard Gambles on 04/22/2019 to discuss treatment options.  She was deemed an appropriate candidate for bilateral uterine artery embolization and presents today for the procedure.Risks and benefits of procedure were discussed with the patient including, but not limited to bleeding, infection, vascular injury or contrast induced renal failure.  This interventional procedure involves the use of X-rays and because of the nature of the planned procedure, it is possible that we will have prolonged use of X-ray fluoroscopy.  Potential radiation risks to you include (but are not limited to) the following: - A slightly elevated  risk for cancer  several years later in life. This risk is typically less than 0.5% percent. This risk is low in comparison to the normal incidence of human cancer, which is 33% for women and 50% for men according to the [place]. - Radiation induced injury can include skin redness, resembling a rash, tissue breakdown / ulcers and hair loss (which can be temporary or permanent).   The likelihood of either of these occurring depends on the difficulty of the procedure and whether you are sensitive to radiation due to previous procedures, disease, or genetic conditions.   IF your procedure requires a prolonged use of radiation, you will be notified and given written instructions for further action.  It is your responsibility to monitor the irradiated area for the 2 weeks following the procedure and to notify your physician if you are concerned that you have suffered a radiation induced injury.    All of the patient's questions were answered, patient is agreeable to proceed.  Consent signed and in chart.  Post procedure she will be admitted for overnight observation for pain control.  LABS PENDING  Electronically Signed: D. Rowe Robert, PA-C 07/12/2019, 12:15 PM   I spent a total of 30 minutes at the the patient's bedside AND on the patient's hospital floor or unit, greater than 50% of which was counseling/coordinating care for bilateral uterine artery embolization

## 2019-07-13 DIAGNOSIS — D219 Benign neoplasm of connective and other soft tissue, unspecified: Secondary | ICD-10-CM | POA: Diagnosis not present

## 2019-07-13 MED ORDER — HYDROCODONE-ACETAMINOPHEN 5-325 MG PO TABS: 1.0000 | ORAL_TABLET | ORAL | 0 refills | Status: DC | PRN

## 2019-07-13 MED ORDER — PROMETHAZINE HCL 25 MG PO TABS: 25.0000 mg | ORAL_TABLET | Freq: Three times a day (TID) | ORAL | 0 refills | Status: DC | PRN

## 2019-07-13 MED ORDER — DOCUSATE SODIUM 100 MG PO CAPS: 100.0000 mg | ORAL_CAPSULE | Freq: Two times a day (BID) | ORAL | 0 refills | Status: DC

## 2019-07-13 NOTE — Progress Notes (Signed)
Patient discharged to home w/ SO. Given all belongings, instructions. Verbalized understanding of all instructions. Escorted to pov via w/c.

## 2019-07-13 NOTE — Discharge Summary (Signed)
Patient ID: SYD BEARE MRN: [id number] DOB/AGE: 51-Apr-1972 51 y.o.  Admit date: 07/12/2019 Discharge date: 07/13/2019  Supervising Physician: Arne Cleveland  Patient Status: [facility] - In-pt  Admission Diagnoses: Symptomatic uterine fibroids  Discharge Diagnoses: Symptomatic uterine fibroids, status post technically successful bilateral uterine artery embolization on 07/12/2019 Active Problems:   Uterine fibroid  Past Medical History:  Diagnosis Date  . Complication of anesthesia    pt feels she has prolonged effects of anes, lethargy and feels 'woozey"  . History of PCOS   . Medical history non-contributory   . Ovarian cyst    Past Surgical History:  Procedure Laterality Date  . DILATATION & CURRETTAGE/HYSTEROSCOPY WITH RESECTOCOPE N/A 12/03/2012   Procedure: DILATATION & CURETTAGE/HYSTEROSCOPY WITH RESECTION OF ENDOMETRIAL POLYP and fibroid;  Surgeon: Ena Dawley, MD;  Location: [facility];  Service: Gynecology;  Laterality: N/A;  . DILATION AND CURETTAGE OF UTERUS     x3  . IR ANGIOGRAM PELVIS SELECTIVE OR SUPRASELECTIVE  07/12/2019  . IR ANGIOGRAM PELVIS SELECTIVE OR SUPRASELECTIVE  07/12/2019  . IR ANGIOGRAM SELECTIVE EACH ADDITIONAL VESSEL  07/12/2019  . IR ANGIOGRAM SELECTIVE EACH ADDITIONAL VESSEL  07/12/2019  . IR EMBO TUMOR ORGAN ISCHEMIA INFARCT INC GUIDE ROADMAPPING  07/12/2019  . IR RADIOLOGIST EVAL & MGMT  04/22/2019  . IR US GUIDE VASC ACCESS RIGHT  07/12/2019  . OVARIAN CYST SURGERY    . WISDOM TOOTH EXTRACTION       Discharged Condition: good  Hospital Course: Mrs. Kane is a 49 year old female with history of symptomatic uterine fibroids who underwent tele consultation with Dr. Vernard Gambles on 04/22/2019 to discuss treatment options. She was deemed an appropriate candidate for bilateral uterine artery embolization and underwent the procedure at [facility] on 07/12/2019.  The procedure was performed without immediate complications and she was  subsequently admitted overnight for observation/pain control.  She was placed on Dilaudid PCA pump.  Overnight she did fairly well with expected pelvic cramping.  On the morning of discharge she was stable.  She was able to tolerate her diet, void and ambulate without significant difficulty.  She did have some mild dysuria and slightly blood-tinged urine post Foley catheter removal.  She continued to have some mild pelvic cramping but no significant nausea or vomiting. She was afebrile and deemed stable for discharge.  She will continue her current home medications.  Prescriptions were sent electronically to patient's pharmacy for Norco, Phenergan and Colace. She was also advised to take Advil 600 mg every 6 hours for the next 5 days (with food) followed by tapering to normal doses.  She will be scheduled for follow-up with Dr. Vernard Gambles either in person or virtually in 3 to 4 weeks at [facility]. She will continue current gynecological care with Dr. Garwin Brothers.  Consults: none  Significant Diagnostic Studies:  Results for orders placed or performed during the hospital encounter of [id number]  Basic metabolic panel  Result Value Ref Range   Sodium 135 135 - 145 mmol/L   Potassium 4.4 3.5 - 5.1 mmol/L   Chloride 104 98 - 111 mmol/L   CO2 20 (L) 22 - 32 mmol/L   Glucose, Bld 73 70 - 99 mg/dL   BUN 16 6 - 20 mg/dL   Creatinine, Ser 0.58 0.44 - 1.00 mg/dL   Calcium 8.7 (L) 8.9 - 10.3 mg/dL   GFR calc non Af Amer >60 >60 mL/min   GFR calc Af Amer >60 >60 mL/min   Anion gap 11 5 -  15  CBC with Differential/Platelet  Result Value Ref Range   WBC 7.1 4.0 - 10.5 K/uL   RBC 5.21 (H) 3.87 - 5.11 MIL/uL   Hemoglobin 13.2 12.0 - 15.0 g/dL   HCT 42.2 36.0 - 46.0 %   MCV 81.0 80.0 - 100.0 fL   MCH 25.3 (L) 26.0 - 34.0 pg   MCHC 31.3 30.0 - 36.0 g/dL   RDW 17.1 (H) 11.5 - 15.5 %   Platelets 192 150 - 400 K/uL   nRBC 0.0 0.0 - 0.2 %   Neutrophils Relative % 67 %   Neutro Abs 4.8 1.7 - 7.7 K/uL   Lymphocytes  Relative 22 %   Lymphs Abs 1.6 0.7 - 4.0 K/uL   Monocytes Relative 10 %   Monocytes Absolute 0.7 0.1 - 1.0 K/uL   Eosinophils Relative 1 %   Eosinophils Absolute 0.1 0.0 - 0.5 K/uL   Basophils Relative 0 %   Basophils Absolute 0.0 0.0 - 0.1 K/uL   Immature Granulocytes 0 %   Abs Immature Granulocytes 0.02 0.00 - 0.07 K/uL  hCG, serum, qualitative  Result Value Ref Range   Preg, Serum NEGATIVE NEGATIVE  Protime-INR  Result Value Ref Range   Prothrombin Time 12.7 11.4 - 15.2 seconds   INR 1.0 0.8 - 1.2  Type and screen  Result Value Ref Range   ABO/RH(D) O POS    Antibody Screen NEG    Sample Expiration      07/15/2019,2359 Performed at [facility], [place] [address] [place], [place] [address]   ABO/Rh  Result Value Ref Range   ABO/RH(D)      O POS Performed at [facility] [address] [place], [place] [address]      Treatments: Technically successful bilateral uterine artery embolization via IV conscious sedation on 07/12/2019  Discharge Exam: Blood pressure (!) 152/90, pulse 90, temperature 98.9 F (37.2 C), temperature source Oral, resp. rate 18, height 5' 4.5" (1.638 m), weight 194 lb (88 kg), last menstrual period 06/21/2019, SpO2 99 %. Awake, alert.  Chest clear to auscultation bilaterally.  Heart with regular rate and rhythm.  Abdomen soft, positive bowel sounds, mildly tender pelvic region to palpation.  Puncture site right groin soft, clean, dry nontender, no hematoma.  Intact distal pulses.  Disposition: home   Allergies as of 07/13/2019      Reactions   Wheat Bran Nausea And Vomiting   Pt has a wheat sensitivity    Latex Hives   Reaction to stretch elastic bandage   Sulfa Antibiotics Other (See Comments)   Pt can not remember exact reaction; possible rash   Sulfamethoxazole-trimethoprim Other (See Comments)   Unknown allergic reaction      Medication List    TAKE these medications   docusate sodium 100 MG  capsule Commonly known as: COLACE Take 1 capsule (100 mg total) by mouth 2 (two) times daily.   ferrous sulfate 325 (65 FE) MG tablet Take 325 mg by mouth daily with breakfast.   HYDROcodone-acetaminophen 5-325 MG tablet Commonly known as: NORCO/VICODIN Take 1-2 tablets by mouth every 4 (four) hours as needed for moderate pain.   ibuprofen 200 MG tablet Commonly known as: ADVIL Take 600 mg by mouth every 6 (six) hours as needed (pain). What changed: Another medication with the same name was removed. Continue taking this medication, and follow the directions you see here.   promethazine 25 MG tablet Commonly known as: PHENERGAN Take 1 tablet (25  mg total) by mouth every 8 (eight) hours as needed for nausea.      Follow-up Information    Arne [place], MD Follow up.   Specialties: Interventional Radiology, Radiology Why: Radiology service will call you with follow-up appointment, likely virtual, with Dr. Vernard Gambles in 3 to 4 weeks; call [phone] or [phone] with any questions Contact information: [place] [address] [phone]        Servando Salina, MD Follow up.   Specialty: Obstetrics and Gynecology Why: Continue current gynecological care with Dr. Garwin Brothers as scheduled Contact information: [address] [place]  [address] [phone]            Electronically Signed: D. Rowe Robert, PA-C 07/13/2019, 10:04 AM   I have spent Less Than 30 Minutes discharging Shelby.

## 2019-07-13 NOTE — Discharge Instructions (Signed)
Uterine Artery Embolization for Fibroids, Care After °This sheet gives you information about how to care for yourself after your procedure. Your health care provider may also give you more specific instructions. If you have problems or questions, contact your health care provider. °What can I expect after the procedure? °After your procedure, it is common to have: °· Pelvic cramping. You will be given pain medicine. °· Nausea and vomiting. You may be given medicine to help relieve nausea. °Follow these instructions at home: °Incision care °· Follow instructions from your health care provider about how to take care of your incision. Make sure you: °? Wash your hands with soap and water before you change your bandage (dressing). If soap and water are not available, use hand sanitizer. °? Change your dressing as told by your health care provider. °· Check your incision area every day for signs of infection. Check for: °? More redness, swelling, or pain. °? More fluid or blood. °? Warmth. °? Pus or a bad smell. °Medicines ° °· Take over-the-counter and prescription medicines only as told by your health care provider. °· Do not take aspirin. It can cause bleeding. °· Do not drive for 24 hours if you were given a medicine to help you relax (sedative). °· Do not drive or use heavy machinery while taking prescription pain medicine. °General instructions °· Ask your health care provider when you can resume sexual activity. °· To prevent or treat constipation while you are taking prescription pain medicine, your health care provider may recommend that you: °? Drink enough fluid to keep your urine clear or pale yellow. °? Take over-the-counter or prescription medicines. °? Eat foods that are high in fiber, such as fresh fruits and vegetables, whole grains, and beans. °? Limit foods that are high in fat and processed sugars, such as fried and sweet foods. °Contact a health care provider if: °· You have a fever. °· You have more  redness, swelling, or pain around your incision site. °· You have more fluid or blood coming from your incision site. °· Your incision feels warm to the touch. °· You have pus or a bad smell coming from your incision. °· You have a rash. °· You have uncontrolled nausea or you cannot eat or drink anything without vomiting. °Get help right away if: °· You have trouble breathing. °· You have chest pain. °· You have severe abdominal pain. °· You have leg pain. °· You become dizzy and faint. °Summary °· After your procedure, it is common to have pelvic cramping. You will be given pain medicine. °· Follow instructions from your health care provider about how to take care of your incision. °· Check your incision area every day for signs of infection. °· Take over-the-counter and prescription medicines only as told by your health care provider. °This information is not intended to replace advice given to you by your health care provider. Make sure you discuss any questions you have with your health care provider. °Document Revised: 03/07/2017 Document Reviewed: 06/27/2016 °Elsevier Patient Education © 2020 [employer] ° °

## 2019-07-21 ENCOUNTER — Ambulatory Visit: Payer: 59 | Attending: Internal Medicine

## 2019-07-21 DIAGNOSIS — Z23 Encounter for immunization: Secondary | ICD-10-CM

## 2019-07-21 NOTE — Progress Notes (Signed)
   Covid-19 Vaccination Clinic  Name:  CHEYAN SIGONA    MRN: [id number] DOB: 1971-03-25  07/21/2019  [name] was observed post Covid-19 immunization for 15 minutes without incident. She was provided with Vaccine Information Sheet and instruction to access the V-Safe system.   Ms. Biel was instructed to call 911 with any severe reactions post vaccine: Marland Kitchen Difficulty breathing  . Swelling of face and throat  . A fast heartbeat  . A bad rash all over body  . Dizziness and weakness   Immunizations Administered    Name Date Dose VIS Date Route   Pfizer COVID-19 Vaccine 07/21/2019  9:38 AM 0.3 mL 03/19/2019 Intramuscular   Manufacturer: [place]   Lot: [id number]   [place]: [id number]

## 2019-07-26 ENCOUNTER — Encounter (HOSPITAL_COMMUNITY): Payer: Self-pay

## 2019-07-26 ENCOUNTER — Other Ambulatory Visit: Payer: Self-pay

## 2019-07-26 ENCOUNTER — Emergency Department (HOSPITAL_COMMUNITY)
Admission: EM | Admit: 2019-07-26 | Discharge: 2019-07-27 | Disposition: A | Discharge status: Home / Self Care | Payer: 59 | Attending: Emergency Medicine | Admitting: Emergency Medicine

## 2019-07-26 DIAGNOSIS — Z79899 Other long term (current) drug therapy: Secondary | ICD-10-CM | POA: Diagnosis not present

## 2019-07-26 DIAGNOSIS — G44209 Tension-type headache, unspecified, not intractable: Secondary | ICD-10-CM

## 2019-07-26 DIAGNOSIS — I10 Essential (primary) hypertension: Secondary | ICD-10-CM | POA: Diagnosis not present

## 2019-07-26 DIAGNOSIS — Z91040 Latex allergy status: Secondary | ICD-10-CM | POA: Insufficient documentation

## 2019-07-26 DIAGNOSIS — E875 Hyperkalemia: Secondary | ICD-10-CM

## 2019-07-26 DIAGNOSIS — R519 Headache, unspecified: Secondary | ICD-10-CM | POA: Diagnosis present

## 2019-07-26 LAB — BASIC METABOLIC PANEL
Anion gap: 10 (ref 5–15)
BUN: 11 mg/dL (ref 6–20)
CO2: 23 mmol/L (ref 22–32)
Calcium: 9.4 mg/dL (ref 8.9–10.3)
Chloride: 108 mmol/L (ref 98–111)
Creatinine, Ser: 0.63 mg/dL (ref 0.44–1.00)
GFR calc Af Amer: >60 mL/min (ref >60)
GFR calc non Af Amer: >60 mL/min (ref >60)
Glucose, Bld: 88 mg/dL (ref 70–99)
Potassium: 5.5 mmol/L — ABNORMAL HIGH (ref 3.5–5.1)
Sodium: 141 mmol/L (ref 135–145)

## 2019-07-26 LAB — I-STAT BETA HCG BLOOD, ED (MC, WL, AP ONLY): I-stat hCG, quantitative: <5 m[IU]/mL (ref <5)

## 2019-07-26 MED ORDER — METOCLOPRAMIDE HCL 5 MG/ML IJ SOLN
10.0000 mg | Freq: Once | INTRAMUSCULAR | Status: AC
  Administered 2019-07-26: 10 mg via INTRAVENOUS
  Filled 2019-07-26: qty 2

## 2019-07-26 MED ORDER — DIPHENHYDRAMINE HCL 50 MG/ML IJ SOLN
25.0000 mg | Freq: Once | INTRAMUSCULAR | Status: AC
  Administered 2019-07-26: 25 mg via INTRAVENOUS
  Filled 2019-07-26: qty 1

## 2019-07-26 NOTE — Discharge Instructions (Addendum)
You can take Tylenol and ibuprofen as needed for headaches. It is important for you to follow-up with your primary care provider so they can determine if you need to be on any medications for high blood pressure. You will also need to have your potassium level rechecked in their office. Return to the ER if you start to experience worsening headache, blurry vision, numbness in arms or legs, chest pain or shortness of breath.  The evaluation and treatment you received in the Emergency Department was rendered on an urgent or emergent basis only. Your evaluation and treatment today was not intended to be a substitute for, or an effort to provide complete continuing medical care. It is very important that you establish care or follow up with a primary care provider for new, remaining or continuing medical problems because it is not always possible to recognize and treat all elements of injuries or illnesses in a single visit. Please see your primary care provider in the next 1-2 days and arrange for other follow-up care as we discussed.

## 2019-07-26 NOTE — ED Notes (Signed)
Pt wants to wait until she gets to the back for blood work

## 2019-07-26 NOTE — ED Notes (Signed)
Urine was not ordered but we have a sample in triage if an order is put in

## 2019-07-26 NOTE — ED Provider Notes (Signed)
[facility] DEPT Provider Note   CSN: [id number] Arrival date & time: 07/26/19  1817     History Chief Complaint  Patient presents with  . Headache  . Hypertension    Krystal Bauer is a 49 y.o. female who presents to ED with a chief complaint of headache and hypertension.  For the past week has been feeling like her blood pressure is high when she checks it with systolics up to the [id number].  She received her second Covid vaccine about 5 days ago.  Since then she has had intermittent bandlike headaches that only been minimally improved with Tylenol.  She may have been told in the past that her blood pressure was high and may have been on antihypertensive but she states that that was several years ago when "I was much younger."  Reports light sensitivity as well.  She denies any neck stiffness, chest pain, shortness of breath, vision changes, vomiting, leg swelling or changes to urination.  HPI     Past Medical History:  Diagnosis Date  . Complication of anesthesia    pt feels she has prolonged effects of anes, lethargy and feels 'woozey"  . History of PCOS   . Medical history non-contributory   . Ovarian cyst     Patient Active Problem List   Diagnosis Date Noted  . Uterine fibroid 07/12/2019  . Obesity 05/02/2014  . Rash and nonspecific skin eruption 05/02/2014  . Bilateral polycystic ovarian syndrome 02/01/2008    Past Surgical History:  Procedure Laterality Date  . DILATATION & CURRETTAGE/HYSTEROSCOPY WITH RESECTOCOPE N/A 12/03/2012   Procedure: DILATATION & CURETTAGE/HYSTEROSCOPY WITH RESECTION OF ENDOMETRIAL POLYP and fibroid;  Surgeon: Ena Dawley, MD;  Location: [facility];  Service: Gynecology;  Laterality: N/A;  . DILATION AND CURETTAGE OF UTERUS     x3  . IR ANGIOGRAM PELVIS SELECTIVE OR SUPRASELECTIVE  07/12/2019  . IR ANGIOGRAM PELVIS SELECTIVE OR SUPRASELECTIVE  07/12/2019  . IR ANGIOGRAM SELECTIVE EACH ADDITIONAL VESSEL   07/12/2019  . IR ANGIOGRAM SELECTIVE EACH ADDITIONAL VESSEL  07/12/2019  . IR EMBO TUMOR ORGAN ISCHEMIA INFARCT INC GUIDE ROADMAPPING  07/12/2019  . IR RADIOLOGIST EVAL & MGMT  04/22/2019  . IR US GUIDE VASC ACCESS RIGHT  07/12/2019  . OVARIAN CYST SURGERY    . WISDOM TOOTH EXTRACTION       OB History    Gravida  0   Para      Term      Preterm      AB      Living        SAB      TAB      Ectopic      Multiple      Live Births              Family History  Problem Relation Age of Onset  . Diabetes Father   . Heart Problems Father   . Parkinson's disease Mother   . Diabetes Mother   . Asthma Mother     Social History   Tobacco Use  . Smoking status: Never Smoker  . Smokeless tobacco: Never Used  Substance Use Topics  . Alcohol use: Yes    Comment: socially   . Drug use: No    Home Medications Prior to Admission medications   Medication Sig Start Date End Date Taking? Authorizing Provider  acetaminophen (TYLENOL) 500 MG tablet Take 1,000 mg by mouth every 6 (six) hours as needed for  headache.   Yes [provider]  docusate sodium (COLACE) 100 MG capsule Take 1 capsule (100 mg total) by mouth 2 (two) times daily. Patient not taking: Reported on 07/26/2019 07/13/19   Allred, Darrell K, PA-C  HYDROcodone-acetaminophen (NORCO/VICODIN) 5-325 MG tablet Take 1-2 tablets by mouth every 4 (four) hours as needed for moderate pain. Patient not taking: Reported on 07/26/2019 07/13/19   Allred, Darrell K, PA-C  promethazine (PHENERGAN) 25 MG tablet Take 1 tablet (25 mg total) by mouth every 8 (eight) hours as needed for nausea. Patient not taking: Reported on 07/26/2019 07/13/19   Allred, Darrell K, PA-C    Allergies    Wheat bran, Latex, Sulfa antibiotics, and Sulfamethoxazole-trimethoprim  Review of Systems   Review of Systems  Constitutional: Negative for appetite change, chills and fever.  HENT: Negative for ear pain, rhinorrhea, sneezing and sore throat.     Eyes: Negative for photophobia and visual disturbance.  Respiratory: Negative for cough, chest tightness, shortness of breath and wheezing.   Cardiovascular: Negative for chest pain and palpitations.  Gastrointestinal: Negative for abdominal pain, blood in stool, constipation, diarrhea, nausea and vomiting.  Genitourinary: Negative for dysuria, hematuria and urgency.  Musculoskeletal: Negative for myalgias.  Skin: Negative for rash.  Neurological: Positive for headaches. Negative for dizziness, weakness and light-headedness.    Physical Exam Updated Vital Signs BP (!) 159/90   Pulse 63   Temp 98.2 F (36.8 C)   Resp 18   Ht 5' 4.5" (1.638 m)   Wt 88.9 kg   LMP 06/11/2019 (Approximate)   SpO2 100%   BMI 33.12 kg/m   Physical Exam Vitals and nursing note reviewed.  Constitutional:      General: She is not in acute distress.    Appearance: She is well-developed.  HENT:     Head: Normocephalic and atraumatic.     Nose: Nose normal.  Eyes:     General: No scleral icterus.       Right eye: No discharge.        Left eye: No discharge.     Conjunctiva/sclera: Conjunctivae normal.     Pupils: Pupils are equal, round, and reactive to light.  Cardiovascular:     Rate and Rhythm: Normal rate and regular rhythm.     Heart sounds: Normal heart sounds. No murmur. No friction rub. No gallop.   Pulmonary:     Effort: Pulmonary effort is normal. No respiratory distress.     Breath sounds: Normal breath sounds.  Abdominal:     General: Bowel sounds are normal. There is no distension.     Palpations: Abdomen is soft.     Tenderness: There is no abdominal tenderness. There is no guarding.  Musculoskeletal:        General: Normal range of motion.     Cervical back: Normal range of motion and neck supple.  Skin:    General: Skin is warm and dry.     Findings: No rash.  Neurological:     General: No focal deficit present.     Mental Status: She is alert and oriented to person,  place, and time.     Cranial Nerves: No cranial nerve deficit.     Sensory: No sensory deficit.     Motor: No weakness or abnormal muscle tone.     Coordination: Coordination normal.     Comments: Pupils reactive. No facial asymmetry noted. Cranial nerves appear grossly intact. Sensation intact to light touch on face, BUE and  BLE. Strength 5/5 in BUE and BLE.      ED Results / Procedures / Treatments   Labs (all labs ordered are listed, but only abnormal results are displayed) Labs Reviewed  BASIC METABOLIC PANEL - Abnormal; Notable for the following components:      Result Value   Potassium 5.5 (*)    All other components within normal limits  I-STAT BETA HCG BLOOD, ED ([facility], [facility], [facility] ONLY)    EKG None  Radiology No results found.  Procedures Procedures (including critical care time)  Medications Ordered in ED Medications  metoCLOPramide (REGLAN) injection 10 mg (10 mg Intravenous Given 07/26/19 2322)  diphenhydrAMINE (BENADRYL) injection 25 mg (25 mg Intravenous Given 07/26/19 2322)    ED Course  I have reviewed the triage vital signs and the nursing notes.  Pertinent labs & imaging results that were available during my care of the patient were reviewed by me and considered in my medical decision making (see chart for details).  Clinical Course as of Apr 20 0000  Mon Jul 26, 2019  2318 BP(!): 154/92 [HK]    Clinical Course User Index [HK] Delia Heady, [place]   MDM Rules/Calculators/A&P                      49 year old female female presents to ED with a chief complaint of headache and hypertension.  Patient underwent an embolization for a fibroid several weeks ago and was told then that her blood pressure was high.  She also recalls in the past she was told that she had hypertension and may have been on an antihypertensive but has been several years since then.  She reports developing a headache over the past 5 days after receiving her second Covid vaccine.  Denies any neck  stiffness, chest pain, shortness of breath or vision changes.  On my exam patient is overall well-appearing.  She is hypertensive to the [id number] systolic.  She has no deficits to her neurological exam.  She is afebrile.  No meningeal signs noted.  We will plan for lab work to evaluate for renal function and treat headache, will reassess.  11:22 PM  Delay in labs and treatment due to lack of IV access. IV team able to place an IV.  Will give medications.  Of note, unable to collect CBC and patient is declining further lab draws as there have been several attempts.  Will wait on results of BMP.  Also, her blood pressure has improved here without intervention.  12:00 AM On recheck, patient with improvement in her symptoms. She is resting comfortably. Her blood pressure continues to improve as well.  there are no headache characteristics that are lateralizing or concerning for increased ICP, infectious or vascular cause of her symptoms. BMP did show K of 5.5, suspect that this could be hemolyzed. She is declining a recheck. Will obtain EKG to evaluate for changes. If unremarkable, will be discharged home. Feel that patient will benefit from a outpatient follow-up with PCP to determine if she needs to be on any antihypertensives.  She is agreeable to this plan.  We will have her return for any worsening symptoms. I did inform her that she will need to recheck her potassium level at her PCP's office. Care handed off to oncoming team.   Portions of this note were generated with Dragon dictation software. Dictation errors may occur despite best attempts at proofreading.   Final Clinical Impression(s) / ED Diagnoses Final diagnoses:  Essential  hypertension  Acute non intractable tension-type headache  Hyperkalemia    Rx / DC Orders ED Discharge Orders    None       Delia Heady, PA-C 07/27/19 0000    Milton Ferguson, MD 07/27/19 [phone]

## 2019-07-26 NOTE — ED Triage Notes (Signed)
Patient c/o headache after receiving Covid immunization on 07/21/19. Patient states headache has been constant. Patient states her BP has been elevated for the past 3 days.  BP-185/102.

## 2019-07-27 MED ORDER — HYDROCHLOROTHIAZIDE 12.5 MG PO TABS: 12.5000 mg | ORAL_TABLET | Freq: Every day | ORAL | 0 refills | Status: DC

## 2019-07-27 NOTE — ED Provider Notes (Signed)
49 year old female received a signout from [facility] pending reevaluation after EKG.  Please see her note for further work-up and medical decision making.  In brief, the patient had a metabolic panel with a potassium level of 5.5.  Patient has no risk factors for hyperkalemia.  She has not been taking oral potassium supplements.  Typically, she has mild hypokalemia.  I suspect this is secondary to hemoconcentration.  Patient is refusing further blood draws in the ER.  Her blood pressure has been downtrending since arrival and her symptoms have otherwise significantly improved.  She is feeling much better and would like to be discharged home.  An EKG was obtained and did not show any peaked T waves.  No previous available for comparison.  There is no evidence of ischemia.  When I evaluated the patient, she was pleasant and in no acute distress.  She is feeling much better.  Blood pressure has improved to [id number] systolic.  We discussed following up with her primary care provider versus starting on a low-dose antihypertensive medication until she can be seen.  She would like a prescription tonight that can further be titrated by primary care.  We will prescribe a low dose of HCTZ.  ER return precautions given.  She is hemodynamically stable to no acute distress.  Safe discharge to home with outpatient follow-up.   Joanne Gavel, PA-C 07/27/19 [id number]    Ezequiel Essex, MD 07/27/19 [phone]

## 2019-08-10 ENCOUNTER — Encounter: Payer: Self-pay | Admitting: *Deleted

## 2019-08-10 ENCOUNTER — Ambulatory Visit
Admission: RE | Admit: 2019-08-10 | Discharge: 2019-08-10 | Disposition: A | Discharge status: Home / Self Care | Payer: 59 | Source: Ambulatory Visit | Attending: Radiology | Admitting: Radiology

## 2019-08-10 ENCOUNTER — Other Ambulatory Visit: Payer: Self-pay

## 2019-08-10 HISTORY — PX: IR RADIOLOGIST EVAL & MGMT: IMG5224

## 2019-08-10 NOTE — Progress Notes (Signed)
Patient ID: Krystal Bauer, female   DOB: 1971-03-27, 49 y.o.   MRN: [id number]       Chief Complaint: Patient was consulted remotely today ([place]) for scheduled follow-up 1 month post  uterine fibroid embolization at the request of Allred,Darrell K.    Referring Physician(s): Servando Salina MD  History of Present Illness: Krystal Bauer is a 49 y.o. female   initially seen  04/22/2019 in consultation, she had been diagnosed approximately 5 years ago with uterine fibroids.  She was having worsening menorrhagia.  She had been recently diagnosed as anemic and started on p.o. iron supplementation.  She also described some fatigue.  She had a history of polycystic ovarian disease for 25 years.  She had only occasional bulk symptoms.  No previous fibroid therapies or surgeries.  She is G0 P0 with no plans for future pregnancy, having undergone extensive infertility work-up.  She had some hot flashes but no other significant perimenopausal symptoms.  Her  last menstrual cycle was December 26, with monthly frequency, lasting 5 to 6 days with 3 days of heavy bleeding requiring changing pads every 2 hours.  No interperiod  Bleeding.  Recent Pap smear 02/23/2019 -.  Endometrial biopsy 04/02/2019 -.  Multiple previous D&Cs for endometrial polyps, all benign.  Pelvic ultrasound 03/09/2019 demonstrated multiple uterine fibroids measuring up to 4.6 cm.  06/17/2019 MR demonstrated multiple uterine fibroids, none pedunculated on a narrow stalk.  No adnexal mass or other pathologic findings.  07/12/2019 patient underwent bilateral uterine artery embolization without complication.  There was some difficulty with Foley catheter placement preprocedure which was unpleasant.  She felt like her post procedure course was as expected.  She was discharged home the following morning.  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  She had significant constipation  as a side effect of the use of the hydrocodone, ultimately requiring Dulcolax.  She recently obtained the Covid vaccine and had post headaches, evaluated in the ED, and diagnosed with hypertension for which she had started medical treatment.  She is back to full activity.  No pelvic pain.   Past Medical History:  Diagnosis Date   Complication of anesthesia    pt feels she has prolonged effects of anes, lethargy and feels 'woozey"   History of PCOS    Medical history non-contributory    Ovarian cyst     Past Surgical History:  Procedure Laterality Date   DILATATION & CURRETTAGE/HYSTEROSCOPY WITH RESECTOCOPE N/A 12/03/2012   Procedure: DILATATION & CURETTAGE/HYSTEROSCOPY WITH RESECTION OF ENDOMETRIAL POLYP and fibroid;  Surgeon: Ena Dawley, MD;  Location: [facility];  Service: Gynecology;  Laterality: N/A;   DILATION AND CURETTAGE OF UTERUS     x3   IR ANGIOGRAM PELVIS SELECTIVE OR SUPRASELECTIVE  07/12/2019   IR ANGIOGRAM PELVIS SELECTIVE OR SUPRASELECTIVE  07/12/2019   IR ANGIOGRAM SELECTIVE EACH ADDITIONAL VESSEL  07/12/2019   IR ANGIOGRAM SELECTIVE EACH ADDITIONAL VESSEL  07/12/2019   IR EMBO TUMOR ORGAN ISCHEMIA INFARCT INC GUIDE ROADMAPPING  07/12/2019   IR RADIOLOGIST EVAL & MGMT  04/22/2019   IR US GUIDE VASC ACCESS RIGHT  07/12/2019   OVARIAN CYST SURGERY     WISDOM TOOTH EXTRACTION      Allergies: Wheat bran, Latex, Sulfa antibiotics, and Sulfamethoxazole-trimethoprim  Medications: Prior to Admission medications   Medication Sig Start Date End Date Taking? Authorizing Provider  acetaminophen (TYLENOL) 500 MG tablet Take 1,000 mg by mouth every 6 (six) hours as  needed for headache.    [provider]  hydrochlorothiazide (HYDRODIURIL) 12.5 MG tablet Take 1 tablet (12.5 mg total) by mouth daily. 07/27/19 08/26/19  McDonald, Mia A, PA-C  promethazine (PHENERGAN) 25 MG tablet Take 1 tablet (25 mg total) by mouth every 8 (eight) hours as needed for  nausea. Patient not taking: Reported on 07/26/2019 07/13/19 07/27/19  Allred, Shirlyn Goltz, PA-C     Family History  Problem Relation Age of Onset   Diabetes Father    Heart Problems Father    Parkinson's disease Mother    Diabetes Mother    Asthma Mother     Social History   Socioeconomic History   Marital status: Single    Spouse name: Not on file   Number of children: Not on file   Years of education: Not on file   Highest education level: Not on file  Occupational History   Not on file  Tobacco Use   Smoking status: Never Smoker   Smokeless tobacco: Never Used  Substance and Sexual Activity   Alcohol use: Yes    Comment: socially    Drug use: No   Sexual activity: Not Currently    Birth control/protection: Abstinence  Other Topics Concern   Not on file  Social History Narrative   Not on file   Social Determinants of Health   Financial Resource Strain:    Difficulty of Paying Living Expenses:   Food Insecurity:    Worried About Charity fundraiser in the Last Year:    Arboriculturist in the Last Year:   Transportation Needs:    Film/video editor (Medical):    Lack of Transportation (Non-Medical):   Physical Activity:    Days of Exercise per Week:    Minutes of Exercise per Session:   Stress:    Feeling of Stress :   Social Connections:    Frequency of Communication with Friends and Family:    Frequency of Social Gatherings with Friends and Family:    Attends Religious Services:    Active Member of Clubs or Organizations:    Attends Music therapist:    Marital Status:     ECOG Status: 1 - Symptomatic but completely ambulatory  Review of Systems  Review of Systems: A 12 point ROS discussed and pertinent positives are indicated in the HPI above.  All other systems are negative.  Physical Exam No direct physical exam was performed (except for noted visual exam findings with Video Visits).     Vital  Signs: There were no vitals taken for this visit.  Imaging: IR Angiogram Pelvis Selective Or Supraselective  Result Date: 07/12/2019 CLINICAL DATA:  Symptomatic uterine fibroids. See previous consultation. EXAM: EXAM BILATERAL UTERINE ARTERY EMBOLIZATION TECHNIQUE: The procedure, risks, benefits, and alternatives were explained to the patient. Questions regarding the procedure were encouraged and answered. The patient understands and consents to the procedure. As antibiotic prophylaxis, cefazolin 2 g was ordered pre-procedure and administered intravenously within one hour of incision. An appropriate skin entry site was determined under fluoroscopy. Skin site was marked, prepped with chlorhexidine, and draped in usual sterile fashion, and infiltrated locally with 1% lidocaine. Intravenous Fentanyl 134mcg and Versed 6mg  were administered as conscious sedation during continuous monitoring of the patient's level of consciousness and physiological / cardiorespiratory status by the radiology RN, with a total moderate sedation time of 52 minutes. Under real-time ultrasound guidance, the right common femoral artery was accessed with a micropuncture needle  set using singlewall technique in a single pass. Ultrasound imaging documentation was saved. Micropuncture dilator exchanged over a Benson wire for a 5 [place] vascular sheath through which a C2 catheter was used to selectively catheterize the left internal iliac artery for pelvic arteriography. A coaxial Progreat catheter was advanced with the Terumo wire and used to selectively catheterize the left uterine artery. The microcatheter tip was positioned in the distal horizontal segment. Selective arteriogram confirms appropriate positioning. Distal branches of the left uterine artery were embolized with 500-700 micron Embospheres. Embolization continued until near stasis of flow was achieved. Microcatheter was withdrawn and a followup selective left internal iliac  arteriogram was obtained. A Waltman loop was then formed with the C2 catheter, and the right internal iliac artery was selectively catheterized. Again the Progreat catheter with Terumo guidewire was coaxially advanced and used to selectively catheterize the right uterine artery. Confirmatory arteriogram was performed. Right uterine artery branches were embolized with 500-700 micron Embospheres to near stasis of flow. A total of 2 vials of Embospheres were utilized for the case. Microcatheter was withdrawn and a followup arteriogram of the right internal iliac artery was performed. C2 catheter removed. After confirmatory femoral angiogram, sheath removed and using the Exoseal device hemostasis was achieved. Patient tolerated the procedure well. FLUOROSCOPY TIME:  17 minutes 36 seconds; A999333 mGy COMPLICATIONS: COMPLICATIONS none IMPRESSION: 1. Technically successful bilateral uterine artery embolization using 500-700 micron Embospheres. Electronically Signed   By: Lucrezia Europe M.D.   On: 07/12/2019 15:34   IR Angiogram Pelvis Selective Or Supraselective  Result Date: 07/12/2019 CLINICAL DATA:  Symptomatic uterine fibroids. See previous consultation. EXAM: EXAM BILATERAL UTERINE ARTERY EMBOLIZATION TECHNIQUE: The procedure, risks, benefits, and alternatives were explained to the patient. Questions regarding the procedure were encouraged and answered. The patient understands and consents to the procedure. As antibiotic prophylaxis, cefazolin 2 g was ordered pre-procedure and administered intravenously within one hour of incision. An appropriate skin entry site was determined under fluoroscopy. Skin site was marked, prepped with chlorhexidine, and draped in usual sterile fashion, and infiltrated locally with 1% lidocaine. Intravenous Fentanyl 139mcg and Versed 6mg  were administered as conscious sedation during continuous monitoring of the patient's level of consciousness and physiological / cardiorespiratory status by  the radiology RN, with a total moderate sedation time of 52 minutes. Under real-time ultrasound guidance, the right common femoral artery was accessed with a micropuncture needle set using singlewall technique in a single pass. Ultrasound imaging documentation was saved. Micropuncture dilator exchanged over a Benson wire for a 5 [place] vascular sheath through which a C2 catheter was used to selectively catheterize the left internal iliac artery for pelvic arteriography. A coaxial Progreat catheter was advanced with the Terumo wire and used to selectively catheterize the left uterine artery. The microcatheter tip was positioned in the distal horizontal segment. Selective arteriogram confirms appropriate positioning. Distal branches of the left uterine artery were embolized with 500-700 micron Embospheres. Embolization continued until near stasis of flow was achieved. Microcatheter was withdrawn and a followup selective left internal iliac arteriogram was obtained. A Waltman loop was then formed with the C2 catheter, and the right internal iliac artery was selectively catheterized. Again the Progreat catheter with Terumo guidewire was coaxially advanced and used to selectively catheterize the right uterine artery. Confirmatory arteriogram was performed. Right uterine artery branches were embolized with 500-700 micron Embospheres to near stasis of flow. A total of 2 vials of Embospheres were utilized for the case. Microcatheter was withdrawn and a followup  arteriogram of the right internal iliac artery was performed. C2 catheter removed. After confirmatory femoral angiogram, sheath removed and using the Exoseal device hemostasis was achieved. Patient tolerated the procedure well. FLUOROSCOPY TIME:  17 minutes 36 seconds; A999333 mGy COMPLICATIONS: COMPLICATIONS none IMPRESSION: 1. Technically successful bilateral uterine artery embolization using 500-700 micron Embospheres. Electronically Signed   By: Lucrezia Europe M.D.    On: 07/12/2019 15:34   IR Angiogram Selective Each Additional Vessel  Result Date: 07/12/2019 CLINICAL DATA:  Symptomatic uterine fibroids. See previous consultation. EXAM: EXAM BILATERAL UTERINE ARTERY EMBOLIZATION TECHNIQUE: The procedure, risks, benefits, and alternatives were explained to the patient. Questions regarding the procedure were encouraged and answered. The patient understands and consents to the procedure. As antibiotic prophylaxis, cefazolin 2 g was ordered pre-procedure and administered intravenously within one hour of incision. An appropriate skin entry site was determined under fluoroscopy. Skin site was marked, prepped with chlorhexidine, and draped in usual sterile fashion, and infiltrated locally with 1% lidocaine. Intravenous Fentanyl 119mcg and Versed 6mg  were administered as conscious sedation during continuous monitoring of the patient's level of consciousness and physiological / cardiorespiratory status by the radiology RN, with a total moderate sedation time of 52 minutes. Under real-time ultrasound guidance, the right common femoral artery was accessed with a micropuncture needle set using singlewall technique in a single pass. Ultrasound imaging documentation was saved. Micropuncture dilator exchanged over a Benson wire for a 5 [place] vascular sheath through which a C2 catheter was used to selectively catheterize the left internal iliac artery for pelvic arteriography. A coaxial Progreat catheter was advanced with the Terumo wire and used to selectively catheterize the left uterine artery. The microcatheter tip was positioned in the distal horizontal segment. Selective arteriogram confirms appropriate positioning. Distal branches of the left uterine artery were embolized with 500-700 micron Embospheres. Embolization continued until near stasis of flow was achieved. Microcatheter was withdrawn and a followup selective left internal iliac arteriogram was obtained. A Waltman loop was  then formed with the C2 catheter, and the right internal iliac artery was selectively catheterized. Again the Progreat catheter with Terumo guidewire was coaxially advanced and used to selectively catheterize the right uterine artery. Confirmatory arteriogram was performed. Right uterine artery branches were embolized with 500-700 micron Embospheres to near stasis of flow. A total of 2 vials of Embospheres were utilized for the case. Microcatheter was withdrawn and a followup arteriogram of the right internal iliac artery was performed. C2 catheter removed. After confirmatory femoral angiogram, sheath removed and using the Exoseal device hemostasis was achieved. Patient tolerated the procedure well. FLUOROSCOPY TIME:  17 minutes 36 seconds; A999333 mGy COMPLICATIONS: COMPLICATIONS none IMPRESSION: 1. Technically successful bilateral uterine artery embolization using 500-700 micron Embospheres. Electronically Signed   By: Lucrezia Europe M.D.   On: 07/12/2019 15:34   IR Angiogram Selective Each Additional Vessel  Result Date: 07/12/2019 CLINICAL DATA:  Symptomatic uterine fibroids. See previous consultation. EXAM: EXAM BILATERAL UTERINE ARTERY EMBOLIZATION TECHNIQUE: The procedure, risks, benefits, and alternatives were explained to the patient. Questions regarding the procedure were encouraged and answered. The patient understands and consents to the procedure. As antibiotic prophylaxis, cefazolin 2 g was ordered pre-procedure and administered intravenously within one hour of incision. An appropriate skin entry site was determined under fluoroscopy. Skin site was marked, prepped with chlorhexidine, and draped in usual sterile fashion, and infiltrated locally with 1% lidocaine. Intravenous Fentanyl 149mcg and Versed 6mg  were administered as conscious sedation during continuous monitoring of the patient's level of  consciousness and physiological / cardiorespiratory status by the radiology RN, with a total moderate sedation  time of 52 minutes. Under real-time ultrasound guidance, the right common femoral artery was accessed with a micropuncture needle set using singlewall technique in a single pass. Ultrasound imaging documentation was saved. Micropuncture dilator exchanged over a Benson wire for a 5 [place] vascular sheath through which a C2 catheter was used to selectively catheterize the left internal iliac artery for pelvic arteriography. A coaxial Progreat catheter was advanced with the Terumo wire and used to selectively catheterize the left uterine artery. The microcatheter tip was positioned in the distal horizontal segment. Selective arteriogram confirms appropriate positioning. Distal branches of the left uterine artery were embolized with 500-700 micron Embospheres. Embolization continued until near stasis of flow was achieved. Microcatheter was withdrawn and a followup selective left internal iliac arteriogram was obtained. A Waltman loop was then formed with the C2 catheter, and the right internal iliac artery was selectively catheterized. Again the Progreat catheter with Terumo guidewire was coaxially advanced and used to selectively catheterize the right uterine artery. Confirmatory arteriogram was performed. Right uterine artery branches were embolized with 500-700 micron Embospheres to near stasis of flow. A total of 2 vials of Embospheres were utilized for the case. Microcatheter was withdrawn and a followup arteriogram of the right internal iliac artery was performed. C2 catheter removed. After confirmatory femoral angiogram, sheath removed and using the Exoseal device hemostasis was achieved. Patient tolerated the procedure well. FLUOROSCOPY TIME:  17 minutes 36 seconds; A999333 mGy COMPLICATIONS: COMPLICATIONS none IMPRESSION: 1. Technically successful bilateral uterine artery embolization using 500-700 micron Embospheres. Electronically Signed   By: Lucrezia Europe M.D.   On: 07/12/2019 15:34   IR US Guide Vasc Access  Right  Result Date: 07/12/2019 CLINICAL DATA:  Symptomatic uterine fibroids. See previous consultation. EXAM: EXAM BILATERAL UTERINE ARTERY EMBOLIZATION TECHNIQUE: The procedure, risks, benefits, and alternatives were explained to the patient. Questions regarding the procedure were encouraged and answered. The patient understands and consents to the procedure. As antibiotic prophylaxis, cefazolin 2 g was ordered pre-procedure and administered intravenously within one hour of incision. An appropriate skin entry site was determined under fluoroscopy. Skin site was marked, prepped with chlorhexidine, and draped in usual sterile fashion, and infiltrated locally with 1% lidocaine. Intravenous Fentanyl 14mcg and Versed 6mg  were administered as conscious sedation during continuous monitoring of the patient's level of consciousness and physiological / cardiorespiratory status by the radiology RN, with a total moderate sedation time of 52 minutes. Under real-time ultrasound guidance, the right common femoral artery was accessed with a micropuncture needle set using singlewall technique in a single pass. Ultrasound imaging documentation was saved. Micropuncture dilator exchanged over a Benson wire for a 5 [place] vascular sheath through which a C2 catheter was used to selectively catheterize the left internal iliac artery for pelvic arteriography. A coaxial Progreat catheter was advanced with the Terumo wire and used to selectively catheterize the left uterine artery. The microcatheter tip was positioned in the distal horizontal segment. Selective arteriogram confirms appropriate positioning. Distal branches of the left uterine artery were embolized with 500-700 micron Embospheres. Embolization continued until near stasis of flow was achieved. Microcatheter was withdrawn and a followup selective left internal iliac arteriogram was obtained. A Waltman loop was then formed with the C2 catheter, and the right internal iliac  artery was selectively catheterized. Again the Progreat catheter with Terumo guidewire was coaxially advanced and used to selectively catheterize the right uterine artery. Confirmatory arteriogram  was performed. Right uterine artery branches were embolized with 500-700 micron Embospheres to near stasis of flow. A total of 2 vials of Embospheres were utilized for the case. Microcatheter was withdrawn and a followup arteriogram of the right internal iliac artery was performed. C2 catheter removed. After confirmatory femoral angiogram, sheath removed and using the Exoseal device hemostasis was achieved. Patient tolerated the procedure well. FLUOROSCOPY TIME:  17 minutes 36 seconds; A999333 mGy COMPLICATIONS: COMPLICATIONS none IMPRESSION: 1. Technically successful bilateral uterine artery embolization using 500-700 micron Embospheres. Electronically Signed   By: Lucrezia Europe M.D.   On: 07/12/2019 15:34   IR EMBO TUMOR ORGAN ISCHEMIA INFARCT INC GUIDE ROADMAPPING  Result Date: 07/12/2019 CLINICAL DATA:  Symptomatic uterine fibroids. See previous consultation. EXAM: EXAM BILATERAL UTERINE ARTERY EMBOLIZATION TECHNIQUE: The procedure, risks, benefits, and alternatives were explained to the patient. Questions regarding the procedure were encouraged and answered. The patient understands and consents to the procedure. As antibiotic prophylaxis, cefazolin 2 g was ordered pre-procedure and administered intravenously within one hour of incision. An appropriate skin entry site was determined under fluoroscopy. Skin site was marked, prepped with chlorhexidine, and draped in usual sterile fashion, and infiltrated locally with 1% lidocaine. Intravenous Fentanyl 169mcg and Versed 6mg  were administered as conscious sedation during continuous monitoring of the patient's level of consciousness and physiological / cardiorespiratory status by the radiology RN, with a total moderate sedation time of 52 minutes. Under real-time ultrasound  guidance, the right common femoral artery was accessed with a micropuncture needle set using singlewall technique in a single pass. Ultrasound imaging documentation was saved. Micropuncture dilator exchanged over a Benson wire for a 5 [place] vascular sheath through which a C2 catheter was used to selectively catheterize the left internal iliac artery for pelvic arteriography. A coaxial Progreat catheter was advanced with the Terumo wire and used to selectively catheterize the left uterine artery. The microcatheter tip was positioned in the distal horizontal segment. Selective arteriogram confirms appropriate positioning. Distal branches of the left uterine artery were embolized with 500-700 micron Embospheres. Embolization continued until near stasis of flow was achieved. Microcatheter was withdrawn and a followup selective left internal iliac arteriogram was obtained. A Waltman loop was then formed with the C2 catheter, and the right internal iliac artery was selectively catheterized. Again the Progreat catheter with Terumo guidewire was coaxially advanced and used to selectively catheterize the right uterine artery. Confirmatory arteriogram was performed. Right uterine artery branches were embolized with 500-700 micron Embospheres to near stasis of flow. A total of 2 vials of Embospheres were utilized for the case. Microcatheter was withdrawn and a followup arteriogram of the right internal iliac artery was performed. C2 catheter removed. After confirmatory femoral angiogram, sheath removed and using the Exoseal device hemostasis was achieved. Patient tolerated the procedure well. FLUOROSCOPY TIME:  17 minutes 36 seconds; A999333 mGy COMPLICATIONS: COMPLICATIONS none IMPRESSION: 1. Technically successful bilateral uterine artery embolization using 500-700 micron Embospheres. Electronically Signed   By: Lucrezia Europe M.D.   On: 07/12/2019 15:34    Labs:  CBC: Recent Labs    07/12/19 1214  WBC 7.1  HGB 13.2   HCT 42.2  PLT 192    COAGS: Recent Labs    07/12/19 1214  INR 1.0    BMP: Recent Labs    07/12/19 1214 07/26/19 1955  NA 135 141  K 4.4 5.5*  CL 104 108  CO2 20* 23  GLUCOSE 73 88  BUN 16 11  CALCIUM 8.7* 9.4  CREATININE 0.58 0.63  GFRNONAA >60 >60  GFRAA >60 >60    LIVER FUNCTION TESTS: No results for input(s): BILITOT, AST, ALT, ALKPHOS, PROT, ALBUMIN in the last 8760 hours.  TUMOR MARKERS: No results for input(s): AFPTM, CEA, CA199, CHROMGRNA in the last 8760 hours.  Assessment and Plan:  My impression is that she has done well post bilateral uterine artery embolization for symptomatic uterine fibroids.  Her post embolization syndrome was well controlled and has resolved.  She is back to full activity.  I have no restrictions on activity.  It takes 3-6 months for the fibroids to completely involute to their final scar down size.  We discussed the likelihood of somewhat irregular menstrual cycles over the next few months.  There may be some residual interperiod Bleeding.  I cautioned her to call us right away should she notice any passage of tissue fragments which might suggest fibroid fragmentation requiring D&C.  Otherwise, we will see her back at the 37-month mark.  She will continue to follow-up with Dr. Garwin Brothers as scheduled.  Thank you for this interesting consult.  I greatly enjoyed meeting Krystal Bauer and look forward to participating in their care.  A copy of this report was sent to the requesting provider on this date.  Electronically Signed: Rickard Rhymes 08/10/2019, 2:31 PM   I spent a total of    25 Minutes in remote  clinical consultation, greater than 50% of which was counseling/coordinating care for symptomatic uterine fibroids, post embolization.    Visit type: Audio and video ([facility] Webex).   Alternative for in-person consultation at [facility], [address], [place], [place]. This visit type was conducted due to  national recommendations for restrictions regarding the COVID-19 Pandemic (e.g. social distancing).  This format is felt to be most appropriate for this patient at this time.  All issues noted in this document were discussed and addressed.

## 2020-01-20 ENCOUNTER — Other Ambulatory Visit: Payer: Self-pay | Admitting: Interventional Radiology

## 2020-01-20 DIAGNOSIS — D250 Submucous leiomyoma of uterus: Secondary | ICD-10-CM

## 2020-02-07 ENCOUNTER — Ambulatory Visit (HOSPITAL_COMMUNITY): Payer: BC Managed Care – PPO

## 2020-02-16 ENCOUNTER — Other Ambulatory Visit: Payer: Self-pay

## 2020-02-16 ENCOUNTER — Ambulatory Visit
Admission: RE | Admit: 2020-02-16 | Discharge: 2020-02-16 | Disposition: A | Discharge status: Home / Self Care | Payer: 59 | Source: Ambulatory Visit | Attending: Interventional Radiology | Admitting: Interventional Radiology

## 2020-02-16 ENCOUNTER — Encounter: Payer: Self-pay | Admitting: *Deleted

## 2020-02-16 DIAGNOSIS — D250 Submucous leiomyoma of uterus: Secondary | ICD-10-CM

## 2020-02-16 DIAGNOSIS — Z9889 Other specified postprocedural states: Secondary | ICD-10-CM | POA: Diagnosis not present

## 2020-02-16 DIAGNOSIS — D259 Leiomyoma of uterus, unspecified: Secondary | ICD-10-CM | POA: Diagnosis not present

## 2020-02-16 HISTORY — PX: IR RADIOLOGIST EVAL & MGMT: IMG5224

## 2020-02-16 NOTE — Progress Notes (Signed)
Patient ID: Krystal Bauer, female   DOB: [birth year], 49 y.o.   MRN: [id number]       Chief Complaint: Patient was consulted remotely today ([place]) for scheduled 50-month follow-up post uterine fibroid embolization at the request of Ulmer Degen.    Referring Physician(s): Servando Salina MD  History of Present Illness: Krystal Bauer is a 49 y.o. female who1/14/2021 had presented to our service  for discussion of treatment options for symptomatic uterine fibroids.  At that time, she had been diagnosed with uterine fibroids for about 5 years, with worsening menorrhagia and anemia requiring p.o. iron supplementation. 07/12/2019 she underwent uncomplicated Uterine fibroid embolization from femoral approach.  There was some unusual difficulty with Foley catheter placement which marred her overall in-hospital experience.  She did well was discharged the following morning is planned.  Her post procedure course at home was unremarkable with normal tapering of post embolization syndrome.  She is done well in the interval.  She not have any pelvic pain.  She had near normal menstrual cycles in June and July but has not had any since then.  Her recently diagnosed hypertension is well controlled with a combination of dietary changes, weight loss, and medications.  No new issues.  Her 35-month post procedure pelvic MRI was denied reimbursement by her insurance carrier, so was deferred.  Past Medical History:  Diagnosis Date  . Complication of anesthesia    pt feels she has prolonged effects of anes, lethargy and feels 'woozey"  . History of PCOS   . Medical history non-contributory   . Ovarian cyst     Past Surgical History:  Procedure Laterality Date  . DILATATION & CURRETTAGE/HYSTEROSCOPY WITH RESECTOCOPE N/A 12/03/2012   Procedure: DILATATION & CURETTAGE/HYSTEROSCOPY WITH RESECTION OF ENDOMETRIAL POLYP and fibroid;  Surgeon: Ena Dawley, MD;  Location: [facility];  Service:  Gynecology;  Laterality: N/A;  . DILATION AND CURETTAGE OF UTERUS     x3  . IR ANGIOGRAM PELVIS SELECTIVE OR SUPRASELECTIVE  07/12/2019  . IR ANGIOGRAM PELVIS SELECTIVE OR SUPRASELECTIVE  07/12/2019  . IR ANGIOGRAM SELECTIVE EACH ADDITIONAL VESSEL  07/12/2019  . IR ANGIOGRAM SELECTIVE EACH ADDITIONAL VESSEL  07/12/2019  . IR EMBO TUMOR ORGAN ISCHEMIA INFARCT INC GUIDE ROADMAPPING  07/12/2019  . IR RADIOLOGIST EVAL & MGMT  04/22/2019  . IR RADIOLOGIST EVAL & MGMT  08/10/2019  . IR US GUIDE VASC ACCESS RIGHT  07/12/2019  . OVARIAN CYST SURGERY    . WISDOM TOOTH EXTRACTION      Allergies: Wheat bran, Latex, Sulfa antibiotics, and Sulfamethoxazole-trimethoprim  Medications: Prior to Admission medications   Medication Sig Start Date End Date Taking? Authorizing Provider  acetaminophen (TYLENOL) 500 MG tablet Take 1,000 mg by mouth every 6 (six) hours as needed for headache.    [provider]  hydrochlorothiazide (HYDRODIURIL) 12.5 MG tablet Take 1 tablet (12.5 mg total) by mouth daily. 07/27/19 08/26/19  McDonald, Mia A, PA-C  promethazine (PHENERGAN) 25 MG tablet Take 1 tablet (25 mg total) by mouth every 8 (eight) hours as needed for nausea. Patient not taking: Reported on 07/26/2019 07/13/19 07/27/19  Allred, Shirlyn Goltz, PA-C     Family History  Problem Relation Age of Onset  . Diabetes Father   . Heart Problems Father   . Parkinson's disease Mother   . Diabetes Mother   . Asthma Mother     Social History   Socioeconomic History  . Marital status: Single    Spouse name: Not on file  .  Number of children: Not on file  . Years of education: Not on file  . Highest education level: Not on file  Occupational History  . Not on file  Tobacco Use  . Smoking status: Never Smoker  . Smokeless tobacco: Never Used  Vaping Use  . Vaping Use: Never used  Substance and Sexual Activity  . Alcohol use: Yes    Comment: socially   . Drug use: No  . Sexual activity: Not Currently    Birth  control/protection: Abstinence  Other Topics Concern  . Not on file  Social History Narrative  . Not on file   Social Determinants of Health   Financial Resource Strain:   . Difficulty of Paying Living Expenses: Not on file  Food Insecurity:   . Worried About Charity fundraiser in the Last Year: Not on file  . Ran Out of Food in the Last Year: Not on file  Transportation Needs:   . Lack of Transportation (Medical): Not on file  . Lack of Transportation (Non-Medical): Not on file  Physical Activity:   . Days of Exercise per Week: Not on file  . Minutes of Exercise per Session: Not on file  Stress:   . Feeling of Stress : Not on file  Social Connections:   . Frequency of Communication with Friends and Family: Not on file  . Frequency of Social Gatherings with Friends and Family: Not on file  . Attends Religious Services: Not on file  . Active Member of Clubs or Organizations: Not on file  . Attends Archivist Meetings: Not on file  . Marital Status: Not on file    ECOG Status: 0 - Asymptomatic  Review of Systems  Review of Systems: A 12 point ROS discussed and pertinent positives are indicated in the HPI above.  All other systems are negative.  Physical Exam No direct physical exam was performed (except for noted visual exam findings with Video Visits).    Vital Signs: There were no vitals taken for this visit.  Imaging:  No interval imaging available Labs:  CBC: Recent Labs    07/12/19 1214  WBC 7.1  HGB 13.2  HCT 42.2  PLT 192    COAGS: Recent Labs    07/12/19 1214  INR 1.0    BMP: Recent Labs    07/12/19 1214 07/26/19 1955  NA 135 141  K 4.4 5.5*  CL 104 108  CO2 20* 23  GLUCOSE 73 88  BUN 16 11  CALCIUM 8.7* 9.4  CREATININE 0.58 0.63  GFRNONAA >60 >60  GFRAA >60 >60    LIVER FUNCTION TESTS: No results for input(s): BILITOT, AST, ALT, ALKPHOS, PROT, ALBUMIN in the last 8760 hours.  TUMOR MARKERS: No results for input(s):  AFPTM, CEA, CA199, CHROMGRNA in the last 8760 hours.  Assessment and Plan:  My impression is that this patient has done very wel post uterine fibroid embolization.  She is essentially asymptomatic with respect to her fibroids, implying excellent response to UFE.  We reviewed the preprocedure imaging and talked about how the small fibroids typically disappear, the larger ones scar down to a nubbin, and none progress to symptomatic level between now menopause, after which time the hormonal stimulation for fibroids stops.  Unfortunately since her post MRI was not approved, she cannot see the pre versus post images to see with her own eyes the anticipated dramatic changes that have happened inside her body.  Ultrasound pelvis could provide some reassurance that  there are anatomic changes correlating with her symptomatic improvement.  I have no activity restrictions for her at this time.    She knows to call  l should she have any questions or problems possibly related to the uterine fibroid embolization procedure. I reminded her to continue her routine schedule gynecologic care as well.  She will continue to follow-up primarily with Dr. Garwin Brothers.   Thank you for this interesting consult.  I greatly enjoyed meeting Krystal Bauer and look forward to participating in their care.  A copy of this report was sent to the requesting provider on this date.  Electronically Signed: Rickard Rhymes 02/16/2020, 1:48 PM   I spent a total of    15 Minutes in remote  clinical consultation, greater than 50% of which was counseling/coordinating care for symptomatic uterine fibroids, post embolization.    Visit type: Audio and video (WebEx).   Alternative for in-person consultation at [facility], [address], [place], [place]. This visit type was conducted due to national recommendations for restrictions regarding the COVID-19 Pandemic (e.g. social distancing).  This format is felt to be most  appropriate for this patient at this time.  All issues noted in this document were discussed and addressed.

## 2020-02-19 ENCOUNTER — Ambulatory Visit: Payer: BC Managed Care – PPO | Attending: Internal Medicine

## 2020-02-19 ENCOUNTER — Other Ambulatory Visit: Payer: Self-pay

## 2020-02-19 DIAGNOSIS — Z23 Encounter for immunization: Secondary | ICD-10-CM

## 2020-02-19 NOTE — Progress Notes (Signed)
   Covid-19 Vaccination Clinic  Name:  ATHALEE ESTERLINE    MRN: [id number] DOB: [birth year]  02/19/2020  [name] was observed post Covid-19 immunization for 15 minutes without incident. She was provided with Vaccine Information Sheet and instruction to access the V-Safe system.   Ms. Widener was instructed to call 911 with any severe reactions post vaccine: Marland Kitchen Difficulty breathing  . Swelling of face and throat  . A fast heartbeat  . A bad rash all over body  . Dizziness and weakness   Immunizations Administered    Name Date Dose VIS Date Route   Pfizer COVID-19 Vaccine 02/19/2020  3:23 PM 0.3 mL 01/26/2020 Intramuscular   Manufacturer: [place]   Lot: [id number]   [place]: [id number]

## 2020-03-09 DIAGNOSIS — D259 Leiomyoma of uterus, unspecified: Secondary | ICD-10-CM | POA: Diagnosis not present

## 2020-03-09 DIAGNOSIS — Z01419 Encounter for gynecological examination (general) (routine) without abnormal findings: Secondary | ICD-10-CM | POA: Diagnosis not present

## 2020-03-09 DIAGNOSIS — N951 Menopausal and female climacteric states: Secondary | ICD-10-CM | POA: Diagnosis not present

## 2020-03-09 DIAGNOSIS — I10 Essential (primary) hypertension: Secondary | ICD-10-CM | POA: Diagnosis not present

## 2020-04-05 DIAGNOSIS — Z1231 Encounter for screening mammogram for malignant neoplasm of breast: Secondary | ICD-10-CM | POA: Diagnosis not present

## 2020-04-17 ENCOUNTER — Ambulatory Visit: Payer: BC Managed Care – PPO | Admitting: Cardiovascular Disease

## 2020-04-17 ENCOUNTER — Encounter: Payer: Self-pay | Admitting: Cardiovascular Disease

## 2020-04-17 ENCOUNTER — Other Ambulatory Visit: Payer: Self-pay

## 2020-04-17 VITALS — BP 150/88 | HR 71 | Ht 64.5 in | Wt 192.0 lb

## 2020-04-17 DIAGNOSIS — Z5181 Encounter for therapeutic drug level monitoring: Secondary | ICD-10-CM

## 2020-04-17 DIAGNOSIS — I10 Essential (primary) hypertension: Secondary | ICD-10-CM | POA: Diagnosis not present

## 2020-04-17 HISTORY — DX: Essential (primary) hypertension: I10

## 2020-04-17 MED ORDER — LOSARTAN POTASSIUM-HCTZ 100-25 MG PO TABS: 1.0000 | ORAL_TABLET | Freq: Every day | ORAL | 3 refills | Status: DC

## 2020-04-17 NOTE — Patient Instructions (Addendum)
Medication Instructions:  INCREASE YOUR LOSARTAN HCT TO 100-25 MG DAILY    Labwork: FASTING LP/CMET/RENIN/ALDOSTERONE/TSH Monday MORNING   HOLD YOUR LOSARTAN Saturday AND Sunday RESUME AFTER YOUR LABS    Testing/Procedures: Your physician has requested that you have a renal artery duplex. During this test, an ultrasound is used to evaluate blood flow to the kidneys. Allow one hour for this exam. Do not eat after midnight the day before and avoid carbonated beverages. Take your medications as you usually do.   Follow-Up: 05/29/2020 AT 2:00 PM WITH PHARM D    You will receive a phone call from the PREP exercise and nutrition program to schedule an initial assessment.   Special Instructions:   Krystal Bauer WILL REACH OUT TO YOU TO DISCUSS STRESS MANAGEMENT   MONITOR YOUR BLOOD PRESSURE AND BRING YOUR READINGS TO YOUR FOLLOW UP APPOINTMENT   LIMIT YOUR SODIUM TO 1,500 MG DAILY   TRY TO EXERCISE  150 MINUTES EACH WEEK   DASH Eating Plan DASH stands for "Dietary Approaches to Stop Hypertension." The DASH eating plan is a healthy eating plan that has been shown to reduce high blood pressure (hypertension). It may also reduce your risk for type 2 diabetes, heart disease, and stroke. The DASH eating plan may also help with weight loss. What are tips for following this plan?  General guidelines  Avoid eating more than 2,300 mg (milligrams) of salt (sodium) a day. If you have hypertension, you may need to reduce your sodium intake to 1,500 mg a day.  Limit alcohol intake to no more than 1 drink a day for nonpregnant women and 2 drinks a day for men. One drink equals 12 oz of beer, 5 oz of wine, or 1 oz of hard liquor.  Work with your health care provider to maintain a healthy body weight or to lose weight. Ask what an ideal weight is for you.  Get at least 30 minutes of exercise that causes your heart to beat faster (aerobic exercise) most days of the week. Activities may include walking, swimming,  or biking.  Work with your health care provider or diet and nutrition specialist (dietitian) to adjust your eating plan to your individual calorie needs. Reading food labels   Check food labels for the amount of sodium per serving. Choose foods with less than 5 percent of the Daily Value of sodium. Generally, foods with less than 300 mg of sodium per serving fit into this eating plan.  To find whole grains, look for the word "whole" as the first word in the ingredient list. Shopping  Buy products labeled as "low-sodium" or "no salt added."  Buy fresh foods. Avoid canned foods and premade or frozen meals. Cooking  Avoid adding salt when cooking. Use salt-free seasonings or herbs instead of table salt or sea salt. Check with your health care provider or pharmacist before using salt substitutes.  Do not fry foods. Cook foods using healthy methods such as baking, boiling, grilling, and broiling instead.  Cook with heart-healthy oils, such as olive, canola, soybean, or sunflower oil. Meal planning  Eat a balanced diet that includes: ? 5 or more servings of fruits and vegetables each day. At each meal, try to fill half of your plate with fruits and vegetables. ? Up to 6-8 servings of whole grains each day. ? Less than 6 oz of lean meat, poultry, or fish each day. A 3-oz serving of meat is about the same size as a deck of cards. One egg  equals 1 oz. ? 2 servings of low-fat dairy each day. ? A serving of nuts, seeds, or beans 5 times each week. ? Heart-healthy fats. Healthy fats called Omega-3 fatty acids are found in foods such as flaxseeds and coldwater fish, like sardines, salmon, and mackerel.  Limit how much you eat of the following: ? Canned or prepackaged foods. ? Food that is high in trans fat, such as fried foods. ? Food that is high in saturated fat, such as fatty meat. ? Sweets, desserts, sugary drinks, and other foods with added sugar. ? Full-fat dairy products.  Do not salt  foods before eating.  Try to eat at least 2 vegetarian meals each week.  Eat more home-cooked food and less restaurant, buffet, and fast food.  When eating at a restaurant, ask that your food be prepared with less salt or no salt, if possible. What foods are recommended? The items listed may not be a complete list. Talk with your dietitian about what dietary choices are best for you. Grains Whole-grain or whole-wheat bread. Whole-grain or whole-wheat pasta. Brown rice. [place] Morrow. Bulgur. Whole-grain and low-sodium cereals. Pita bread. Low-fat, low-sodium crackers. Whole-wheat flour tortillas. Vegetables Fresh or frozen vegetables (raw, steamed, roasted, or grilled). Low-sodium or reduced-sodium tomato and vegetable juice. Low-sodium or reduced-sodium tomato sauce and tomato paste. Low-sodium or reduced-sodium canned vegetables. Fruits All fresh, dried, or frozen fruit. Canned fruit in natural juice (without added sugar). Meat and other protein foods Skinless chicken or [place]. Ground chicken or [place]. Pork with fat trimmed off. Fish and seafood. Egg whites. Dried beans, peas, or lentils. Unsalted nuts, nut butters, and seeds. Unsalted canned beans. Lean cuts of beef with fat trimmed off. Low-sodium, lean deli meat. Dairy Low-fat (1%) or fat-free (skim) milk. Fat-free, low-fat, or reduced-fat cheeses. Nonfat, low-sodium ricotta or cottage cheese. Low-fat or nonfat yogurt. Low-fat, low-sodium cheese. Fats and oils Soft margarine without trans fats. Vegetable oil. Low-fat, reduced-fat, or light mayonnaise and salad dressings (reduced-sodium). Canola, safflower, olive, soybean, and sunflower oils. Avocado. Seasoning and other foods Herbs. Spices. Seasoning mixes without salt. Unsalted popcorn and pretzels. Fat-free sweets. What foods are not recommended? The items listed may not be a complete list. Talk with your dietitian about what dietary choices are best for you. Grains Baked goods  made with fat, such as croissants, muffins, or some breads. Dry pasta or rice meal packs. Vegetables Creamed or fried vegetables. Vegetables in a cheese sauce. Regular canned vegetables (not low-sodium or reduced-sodium). Regular canned tomato sauce and paste (not low-sodium or reduced-sodium). Regular tomato and vegetable juice (not low-sodium or reduced-sodium). Angie Fava. Olives. Fruits Canned fruit in a light or heavy syrup. Fried fruit. Fruit in cream or butter sauce. Meat and other protein foods Fatty cuts of meat. Ribs. Fried meat. Berniece Salines. Sausage. Bologna and other processed lunch meats. Salami. Fatback. Hotdogs. Bratwurst. Salted nuts and seeds. Canned beans with added salt. Canned or smoked fish. Whole eggs or egg yolks. Chicken or [place] with skin. Dairy Whole or 2% milk, cream, and half-and-half. Whole or full-fat cream cheese. Whole-fat or sweetened yogurt. Full-fat cheese. Nondairy creamers. Whipped toppings. Processed cheese and cheese spreads. Fats and oils Butter. Stick margarine. Lard. Shortening. Ghee. Bacon fat. Tropical oils, such as coconut, palm kernel, or palm oil. Seasoning and other foods Salted popcorn and pretzels. Onion salt, garlic salt, seasoned salt, table salt, and sea salt. Worcestershire sauce. Tartar sauce. Barbecue sauce. Teriyaki sauce. Soy sauce, including reduced-sodium. Steak sauce. Canned and packaged gravies. Fish sauce.  Oyster sauce. Cocktail sauce. Horseradish that you find on the shelf. Ketchup. Mustard. Meat flavorings and tenderizers. Bouillon cubes. Hot sauce and Tabasco sauce. Premade or packaged marinades. Premade or packaged taco seasonings. Relishes. Regular salad dressings. Where to find more information:  National Heart, Lung, and [place]: https://wilson-eaton.com/  American Heart Association: www.heart.org Summary  The DASH eating plan is a healthy eating plan that has been shown to reduce high blood pressure (hypertension). It may also reduce  your risk for type 2 diabetes, heart disease, and stroke.  With the DASH eating plan, you should limit salt (sodium) intake to 2,300 mg a day. If you have hypertension, you may need to reduce your sodium intake to 1,500 mg a day.  When on the DASH eating plan, aim to eat more fresh fruits and vegetables, whole grains, lean proteins, low-fat dairy, and heart-healthy fats.  Work with your health care provider or diet and nutrition specialist (dietitian) to adjust your eating plan to your individual calorie needs. This information is not intended to replace advice given to you by your health care provider. Make sure you discuss any questions you have with your health care provider. Document Released: 03/14/2011 Document Revised: 03/07/2017 Document Reviewed: 03/18/2016 Elsevier Patient Education  2020 [employer].

## 2020-04-17 NOTE — Progress Notes (Signed)
Hypertension Clinic Initial Assessment:    Date:  04/17/2020   ID:  Krystal Bauer, DOB 04/11/1970, MRN [id number]  PCP:  Seward Carol, MD  Cardiologist:  No primary care provider on file.  Nephrologist:  Referring MD: Servando Salina, MD   CC: Hypertension  History of Present Illness:    Krystal Bauer is a 50 y.o. female with a hx of hypertension, hyperlipidemia, PCOS and fibroids here to establish care in the advanced hypertension clinic.  April 2021 she had a uterine fibroid procedure.  Following the procedure she struggled with headaches.  She checked her BP and it was 190s/100s.  She went to the ED and it was 200/118.  She was started on HCTZ and losartan was subsequently added.  Her blood pressure continues to be uncontrolled.  It seems to vary throughout the day.  It ranges from <120s-140s/100.  She doesn't get much exercise lately.  She moved three months ago.  She has a treadmill but doesn't have access to it.  She has been working form home and not as active.  She has no exertional chest pain but does have some shortness of breath with exertion.  She denies lower extremity edema, orthopnea, or PND.  She drinks up to 1 diet soda daily and help does not have any other caffeine.  She rarely drinks alcohol.  She does not use any over-the-counter supplements or herbs.  She only uses Tylenol for pain.  She has not had any palpitations lately.  She did have some in graduate school.  This was worked up and was unremarkable.  She notes that her life is intermittently stressful.  She has a lot of work stress.  She also struggles with social stressors.  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  Overall her diet has been very healthy.  She mostly eats at home.  She is not very clear on limiting sodium though she does not add much salt to her food.  She has been gluten-free and since her cholesterol was high in May she changed her  diet to limit animal products.  These changes have improved the way she feels.  Previous antihypertensives: none   Past Medical History:  Diagnosis Date  . Complication of anesthesia    pt feels she has prolonged effects of anes, lethargy and feels 'woozey"  . Essential hypertension 04/17/2020  . History of PCOS   . Medical history non-contributory   . Ovarian cyst     Past Surgical History:  Procedure Laterality Date  . DILATATION & CURRETTAGE/HYSTEROSCOPY WITH RESECTOCOPE N/A 12/03/2012   Procedure: DILATATION & CURETTAGE/HYSTEROSCOPY WITH RESECTION OF ENDOMETRIAL POLYP and fibroid;  Surgeon: Ena Dawley, MD;  Location: [facility];  Service: Gynecology;  Laterality: N/A;  . DILATION AND CURETTAGE OF UTERUS     x3  . IR ANGIOGRAM PELVIS SELECTIVE OR SUPRASELECTIVE  07/12/2019  . IR ANGIOGRAM PELVIS SELECTIVE OR SUPRASELECTIVE  07/12/2019  . IR ANGIOGRAM SELECTIVE EACH ADDITIONAL VESSEL  07/12/2019  . IR ANGIOGRAM SELECTIVE EACH ADDITIONAL VESSEL  07/12/2019  . IR EMBO TUMOR ORGAN ISCHEMIA INFARCT INC GUIDE ROADMAPPING  07/12/2019  . IR RADIOLOGIST EVAL & MGMT  04/22/2019  . IR RADIOLOGIST EVAL & MGMT  08/10/2019  . IR RADIOLOGIST EVAL & MGMT  02/16/2020  . IR US GUIDE VASC ACCESS RIGHT  07/12/2019  . OVARIAN CYST SURGERY    . WISDOM TOOTH EXTRACTION      Current Medications:  Current Meds  Medication Sig  . losartan-hydrochlorothiazide (HYZAAR) 100-25 MG tablet Take 1 tablet by mouth daily.     Allergies:   Wheat bran, Latex, Sulfa antibiotics, and Sulfamethoxazole-trimethoprim   Social History   Socioeconomic History  . Marital status: Single    Spouse name: Not on file  . Number of children: Not on file  . Years of education: Not on file  . Highest education level: Not on file  Occupational History  . Not on file  Tobacco Use  . Smoking status: Never Smoker  . Smokeless tobacco: Never Used  Vaping Use  . Vaping Use: Never used  Substance and Sexual Activity  . Alcohol use:  Yes    Comment: socially   . Drug use: No  . Sexual activity: Not Currently    Birth control/protection: Abstinence  Other Topics Concern  . Not on file  Social History Narrative  . Not on file   Social Determinants of Health   Financial Resource Strain: Not on file  Food Insecurity: Not on file  Transportation Needs: Not on file  Physical Activity: Not on file  Stress: Not on file  Social Connections: Not on file     Family History: The patient's family history includes Asthma in her mother; Diabetes in her father and mother; Heart Problems in her father; Hypertension in her father and mother; Parkinson's disease in her mother; Prostate cancer in her brother.  ROS:   Please see the history of present illness.    All other systems reviewed and are negative.  EKGs/Labs/Other Studies Reviewed:    EKG:  EKG is ordered today.  The ekg ordered today demonstrates sinus rhythm.  Rate 71 bpm.   Recent Labs: 07/12/2019: Hemoglobin 13.2; Platelets 192 07/26/2019: BUN 11; Creatinine, Ser 0.63; Potassium 5.5; Sodium 141   Recent Lipid Panel    Component Value Date/Time   CHOL 200 02/24/2012 1027   TRIG 67 02/24/2012 1027   HDL 57 02/24/2012 1027   CHOLHDL 3.5 02/24/2012 1027   VLDL 13 02/24/2012 1027   LDLCALC 130 (H) 02/24/2012 1027    Physical Exam:    VS:  BP (!) 150/88   Pulse 71   Ht 5' 4.5" (1.638 m)   Wt 192 lb (87.1 kg)   BMI 32.45 kg/m  , BMI Body mass index is 32.45 kg/m. GENERAL:  Well appearing HEENT: Pupils equal round and reactive, fundi not visualized, oral mucosa unremarkable NECK:  No jugular venous distention, waveform within normal limits, carotid upstroke brisk and symmetric, no bruits LUNGS:  Clear to auscultation bilaterally HEART:  RRR.  PMI not displaced or sustained,S1 and S2 within normal limits, no S3, no S4, no clicks, no rubs, no murmurs ABD:  Flat, positive bowel sounds normal in frequency in pitch, no bruits, no rebound, no guarding, no  midline pulsatile mass, no hepatomegaly, no splenomegaly EXT:  2 plus pulses throughout, no edema, no cyanosis no clubbing SKIN:  No rashes no nodules NEURO:  Cranial nerves II through XII grossly intact, motor grossly intact throughout PSYCH:  Cognitively intact, oriented to person place and time   ASSESSMENT:    1. Essential hypertension   2. Therapeutic drug monitoring     PLAN:    # Essential hypertension: Krystal Bauer's blood pressure is elevated here and has been high at home as well.  We will start with doubling the dose of her losartan/HCTZ.  If her blood pressure remains elevated will consider adding amlodipine.  We discussed  the fact that her blood pressure is normal and spiked suddenly last year.  Therefore we will do a work-up for secondary causes of hypertension.  We will get renal artery Dopplers and check renain and aldosterone after holding losartan for 2 days.  Given that she is increasing the dose of her medication she will need another basic metabolic panel 1 week after that.  We discussed regular exercise to lose 150 minutes.  She is interested in enrolling in the PREP exercise and nutrition program through the [facility].  She is also interested in talking with her about stress management techniques.  We discussed the DASH diet and limiting to 1500 mg of sodium daily.  She will meet with our Care Guide to discuss stress management techniques.  Secondary Causes of Hypertension  Medications/Herbal: OCP, steroids, stimulants, antidepressants, weight loss medication, immune suppressants, NSAIDs, sympathomimetics, alcohol, caffeine, licorice, ginseng, St. John's wort, chemo (none identified)  Sleep Apnea: testing not indicated Renal artery stenosis: Check renal artery Doppler Hyperaldosteronism: hold losartan.  Check renin/aldosterone Hyper/hypothyroidism: Check TSH Pheochromocytoma: (testing not indicated)  Cushing's syndrome: (testing not indicated)  Coarctation of the  aorta: BP symmetric  Disposition:    FU with MD/PharmD in 1 month    Medication Adjustments/Labs and Tests Ordered: Current medicines are reviewed at length with the patient today.  Concerns regarding medicines are outlined above.  Orders Placed This Encounter  Procedures  . Lipid panel  . TSH  . Aldosterone + renin activity w/ ratio  . Comprehensive metabolic panel  . EKG 12-Lead  . VAS US RENAL ARTERY DUPLEX   Meds ordered this encounter  Medications  . losartan-hydrochlorothiazide (HYZAAR) 100-25 MG tablet    Sig: Take 1 tablet by mouth daily.    Dispense:  90 tablet    Refill:  3    NEW DOSE, PATIENT WILL CALL WHEN NEEDS FILLED     Signed, Skeet Latch, MD  04/17/2020 10:36 PM    [facility]

## 2020-04-18 ENCOUNTER — Telehealth: Payer: Self-pay

## 2020-04-18 NOTE — Telephone Encounter (Signed)
Call placed to pt Explained program --both health/nutrition advice, cardio and strength training Expressed concern about coming to gym due to Covid Explained that I have classes that will also be starting in Feb Will need an evening class.  Will be starting an evening class at [facility] in Feb Will call her back then

## 2020-04-19 ENCOUNTER — Telehealth: Payer: Self-pay

## 2020-04-19 DIAGNOSIS — Z0000 Encounter for general adult medical examination without abnormal findings: Secondary | ICD-10-CM

## 2020-04-19 NOTE — Telephone Encounter (Signed)
Called patient to discuss health coaching for stress management per Dr. Blenda Mounts referral. Patient is interested and have been scheduled for an initial session on 05/01/20 at 1:30pm.

## 2020-04-26 DIAGNOSIS — I10 Essential (primary) hypertension: Secondary | ICD-10-CM | POA: Diagnosis not present

## 2020-04-26 DIAGNOSIS — Z5181 Encounter for therapeutic drug level monitoring: Secondary | ICD-10-CM | POA: Diagnosis not present

## 2020-04-27 ENCOUNTER — Other Ambulatory Visit: Payer: Self-pay

## 2020-04-27 ENCOUNTER — Telehealth: Payer: Self-pay

## 2020-04-27 DIAGNOSIS — Z0000 Encounter for general adult medical examination without abnormal findings: Secondary | ICD-10-CM

## 2020-04-27 MED ORDER — LOSARTAN POTASSIUM-HCTZ 100-25 MG PO TABS: 1.0000 | ORAL_TABLET | Freq: Every day | ORAL | 3 refills | Status: DC

## 2020-04-27 NOTE — Telephone Encounter (Signed)
Patient calling to verify if her appointment on Monday is virtual or in the office.

## 2020-04-27 NOTE — Telephone Encounter (Signed)
Will reaching out to patient to let her know it's in the office.

## 2020-04-27 NOTE — Telephone Encounter (Signed)
Called patient to inform her per her inquiry that her appointment on 05/01/20 at 1:30pm was in-person. Patient is concerned about the weather and requested it to be virtual/telephonic. Patient will be called by Care Guide via telephone during scheduled appointment time.   A health coaching agreement will be mailed to the patient on 04/27/20 for her signature.

## 2020-05-01 ENCOUNTER — Other Ambulatory Visit: Payer: Self-pay

## 2020-05-01 ENCOUNTER — Telehealth: Payer: Self-pay | Admitting: Cardiovascular Disease

## 2020-05-01 ENCOUNTER — Ambulatory Visit (INDEPENDENT_AMBULATORY_CARE_PROVIDER_SITE_OTHER): Payer: BC Managed Care – PPO

## 2020-05-01 DIAGNOSIS — Z0000 Encounter for general adult medical examination without abnormal findings: Secondary | ICD-10-CM

## 2020-05-01 DIAGNOSIS — I10 Essential (primary) hypertension: Secondary | ICD-10-CM

## 2020-05-01 DIAGNOSIS — Z5181 Encounter for therapeutic drug level monitoring: Secondary | ICD-10-CM

## 2020-05-01 LAB — COMPREHENSIVE METABOLIC PANEL
ALT: 15 IU/L (ref 0–32)
AST: 19 IU/L (ref 0–40)
Albumin/Globulin Ratio: 1.6 (ref 1.2–2.2)
Albumin: 4.5 g/dL (ref 3.8–4.9)
Alkaline Phosphatase: 116 IU/L (ref 44–121)
BUN/Creatinine Ratio: 22 (ref 9–23)
BUN: 17 mg/dL (ref 6–24)
Bilirubin Total: 0.4 mg/dL (ref 0.0–1.2)
CO2: 21 mmol/L (ref 20–29)
Calcium: 9.5 mg/dL (ref 8.7–10.2)
Chloride: 96 mmol/L (ref 96–106)
Creatinine, Ser: 0.78 mg/dL (ref 0.57–1.00)
GFR calc Af Amer: 102 mL/min/{1.73_m2} (ref >59)
GFR calc non Af Amer: 88 mL/min/{1.73_m2} (ref >59)
Globulin, Total: 2.8 g/dL (ref 1.5–4.5)
Glucose: 81 mg/dL (ref 65–99)
Potassium: 3.8 mmol/L (ref 3.5–5.2)
Sodium: 136 mmol/L (ref 134–144)
Total Protein: 7.3 g/dL (ref 6.0–8.5)

## 2020-05-01 LAB — LIPID PANEL
Chol/HDL Ratio: 3.8 ratio (ref 0.0–4.4)
Cholesterol, Total: 220 mg/dL — ABNORMAL HIGH (ref 100–199)
HDL: 58 mg/dL (ref >39)
LDL Chol Calc (NIH): 152 mg/dL — ABNORMAL HIGH (ref 0–99)
Triglycerides: 60 mg/dL (ref 0–149)
VLDL Cholesterol Cal: 10 mg/dL (ref 5–40)

## 2020-05-01 LAB — ALDOSTERONE + RENIN ACTIVITY W/ RATIO: ALDOSTERONE: 16.5 ng/dL (ref 0.0–30.0)

## 2020-05-01 LAB — TSH: TSH: 2.79 u[IU]/mL (ref 0.450–4.500)

## 2020-05-01 NOTE — Telephone Encounter (Signed)
Patient called and said that she received her results on mychart and saw where some was higher than normal. Wants to talk with Dr. Oval Linsey or nurse regarding results. Please call

## 2020-05-01 NOTE — Progress Notes (Signed)
Appointment Outcome:  Completed, Session #: Initial Session  AGREEMENTS SECTION   Overall Goal(s): Stress management                                                Agreement/Action Steps:  Take a lunch break away from computer between 12-12:30pm (maybe longer). Engage in music therapy with friend's mom on Sunday (playing piano and singing).  Incorporate a wind down before bed that includes listening to [place] on Pandora/YouTube. Monitor food eaten during periods of stress, especially sugar intake.   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 Patient states that working for days straight through without getting out the house is stressful, when getting out helps her. Patient stated that sometimes she must work through the weekend as well, depending on doctors' schedules.  Patient stated that she is a music person and used that as a coping strategy prior to Covid. Patient mentioned that she would participate in choir or go to concerts.   Patient recognizes that she is a stress eater but have made changes to her diet since last June. Currently, she feels that she has an issue with consuming a lot of sweets but made a switch to [employer] cookies with only 1 gram of sugar per serving and is low in carbs and high in protein.   Patient stated that she knows her triggers: grief from losing parents (mother most recent), things that are out of her control, and anxiety around Covid. Patient stated that she tries to suppress her emotions, but knows she wears her emotions on her face. Patient states that she gains the most weight during periods of stress and would like to work on a eating schedule to help maintain a healthy weight.    . Indicators of Success and Accountability:  To manage to incorporate the lunch break as planned. . Readiness: Patient is in the  preparation stage of stress management. . Strengths and Supports: Patient has a close friend for support. 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 . Challenges and Barriers: Work schedule and grief may be challenges.   Coaching Outcomes: Patient will block off time to dedicate towards health coaching session. Patient will aim to incorporate a lunch break into her work routine to break up the day and change the scenery for a period of time to help her reset/decompress while she eats a plant-based meal. Patient will monitor what she eats during periods of stress to reduce the amount of sugar she is consuming.  Patient will incorporate a wind down period before bed that includes listening to [place] to help her end the day on a positive note, while relaxing her mind from the day.   Patient will hold a conversation with friend's mom about playing the piano for her while she sings on Sundays as a means to relieve stress.   Patient is interested in improving eating behaviors for weight loss. Will discuss in next health coaching session as a goal.   Health Coaching agreement was mailed to patient for signature and return to Care Guide.   Patient's takeaway from today's session is that she recognizes there are other ways that  she can take towards stress management and that talking about stress helps organize her thoughts.

## 2020-05-01 NOTE — Patient Instructions (Signed)
Managing Stress, Adult Feeling a certain amount of stress is normal. Stress helps our body and mind get ready to deal with the demands of life. Stress hormones can motivate you to do well at work and meet your responsibilities. However severe or long-lasting (chronic) stress can affect your mental and physical health. Chronic stress puts you at higher risk for anxiety, depression, and other health problems like digestive problems, muscle aches, heart disease, high blood pressure, and stroke. What are the causes? Common causes of stress include:  Demands from work, such as deadlines, feeling overworked, or having long hours.  Pressures at home, such as money issues, disagreements with a spouse, or parenting issues.  Pressures from major life changes, such as divorce, moving, loss of a loved one, or chronic illness. You may be at higher risk for stress-related problems if you do not get enough sleep, are in poor health, do not have emotional support, or have a mental health disorder like anxiety or depression. How to recognize stress Stress can make you:  Have trouble sleeping.  Feel sad, anxious, irritable, or overwhelmed.  Lose your appetite.  Overeat or want to eat unhealthy foods.  Want to use drugs or alcohol. Stress can also cause physical symptoms, such as:  Sore, tense muscles, especially in the shoulders and neck.  Headaches.  Trouble breathing.  A faster heart rate.  Stomach pain, nausea, or vomiting.  Diarrhea or constipation.  Trouble concentrating. Follow these instructions at home: Lifestyle  Identify the source of your stress and your reaction to it. See a therapist who can help you change your reactions.  When there are stressful events: ? Talk about it with family, friends, or co-workers. ? Try to think realistically about stressful events and not ignore them or overreact. ? Try to find the positives in a stressful situation and not focus on the  negatives. ? Cut back on responsibilities at work and home, if possible. Ask for help from friends or family members if you need it.  Find ways to cope with stress, such as: ? Meditation. ? Deep breathing. ? Yoga or tai chi. ? Progressive muscle relaxation. ? Doing art, playing music, or reading. ? Making time for fun activities. ? Spending time with family and friends.  Get support from family, friends, or spiritual resources. Eating and drinking  Eat a healthy diet. This includes: ? Eating foods that are high in fiber, such as beans, whole grains, and fresh fruits and vegetables. ? Limiting foods that are high in fat and processed sugars, such as fried and sweet foods.  Do not skip meals or overeat.  Drink enough fluid to keep your urine pale yellow. Alcohol use  Do not drink alcohol if: ? Your health care provider tells you not to drink. ? You are pregnant, may be pregnant, or are planning to become pregnant.  Drinking alcohol is a way some people try to ease their stress. This can be dangerous, so if you drink alcohol: ? Limit how much you use to:  0-1 drink a day for women.  0-2 drinks a day for men. ? Be aware of how much alcohol is in your drink. In the [place] one drink equals one 12 oz bottle of beer (355 mL), one 5 oz glass of wine (148 mL), or one 1 oz glass of hard liquor (44 mL). Activity  Include 30 minutes of exercise in your daily schedule. Exercise is a good stress reducer.  Include time in your day for   an activity that you find relaxing. Try taking a walk, going on a bike ride, reading a book, or listening to music.  Schedule your time in a way that lowers stress, and keep a consistent schedule. Prioritize what is most important to get done.   General instructions  Get enough sleep. Try to go to sleep and get up at about the same time every day.  Take over-the-counter and prescription medicines only as told by your health care provider.  Do not use any  products that contain nicotine or tobacco, such as cigarettes, e-cigarettes, and chewing tobacco. If you need help quitting, ask your health care provider.  Do not use drugs or smoke to cope with stress.  Keep all follow-up visits as told by your health care provider. This is important. Where to find support  Talk with your health care provider about stress management or finding a support group.  Find a therapist to work with you on your stress management techniques. Contact a health care provider if:  Your stress symptoms get worse.  You are unable to manage your stress at home.  You are struggling to stop using drugs or alcohol. Get help right away if:  You may be a danger to yourself or others.  You have any thoughts of death or suicide. If you ever feel like you may hurt yourself or others, or have thoughts about taking your own life, get help right away. You can go to your nearest emergency department or call:  Your local emergency services (911 in the [place]  A suicide crisis helpline, such as the National Suicide Prevention Lifeline at 1-800-273-8255. This is open 24 hours a day. Summary  Feeling a certain amount of stress is normal, but severe or long-lasting (chronic) stress can affect your mental and physical health.  Chronic stress can put you at higher risk for anxiety, depression, and other health problems like digestive problems, muscle aches, heart disease, high blood pressure, and stroke.  You may be at higher risk for stress-related problems if you do not get enough sleep, are in poor health, lack emotional support, or have a mental health disorder like anxiety or depression.  Identify the source of your stress and your reaction to it. Try talking about stressful events with family, friends, or co-workers, finding a coping method, or getting support from spiritual resources.  If you need more help, talk with your health care provider about finding a support group  or a mental health therapist. This information is not intended to replace advice given to you by your health care provider. Make sure you discuss any questions you have with your health care provider. Document Revised: 10/21/2018 Document Reviewed: 10/21/2018 Elsevier Patient Education  2021 Elsevier Inc.  

## 2020-05-08 ENCOUNTER — Ambulatory Visit (HOSPITAL_COMMUNITY)
Admission: RE | Admit: 2020-05-08 | Discharge: 2020-05-08 | Disposition: A | Discharge status: Home / Self Care | Payer: BC Managed Care – PPO | Source: Ambulatory Visit | Attending: Internal Medicine | Admitting: Internal Medicine

## 2020-05-08 ENCOUNTER — Other Ambulatory Visit: Payer: Self-pay

## 2020-05-08 DIAGNOSIS — I10 Essential (primary) hypertension: Secondary | ICD-10-CM | POA: Insufficient documentation

## 2020-05-08 MED ORDER — ROSUVASTATIN CALCIUM 20 MG PO TABS: 20.0000 mg | ORAL_TABLET | Freq: Every day | ORAL | 3 refills | Status: DC

## 2020-05-08 NOTE — Telephone Encounter (Signed)
-----   Message from Skeet Latch, MD sent at 05/03/2020  5:41 PM EST ----- Her cholesterol levels are quite elevated.  If we do not do anything to change this her risk of having a heart attack or stroke in the next 10 years is 15%.  Therefore recommend starting rosuvastatin 20 mg daily.  Repeat lipids and a CMP in 3 months.  Work on limiting fried foods, fatty foods, red meat, and cheese.  Exercises 150 minutes weekly.  Thyroid function is normal.  Kidney function, liver function, and electrolytes are normal.  Unfortunately not enough blood was obtained for the renal and aldosterone levels.  Recommend holding losartan again for 2 days and repeating the lab.  The patient should not be charged for this again.

## 2020-05-08 NOTE — Telephone Encounter (Signed)
Advised patient of lab results, new Rx sent to pharmacy and lab orders mailed to patient

## 2020-05-10 NOTE — Addendum Note (Signed)
Addended by: Earvin Hansen on: 05/10/2020 06:28 PM   Modules accepted: Orders

## 2020-05-15 ENCOUNTER — Other Ambulatory Visit: Payer: Self-pay

## 2020-05-15 ENCOUNTER — Ambulatory Visit (INDEPENDENT_AMBULATORY_CARE_PROVIDER_SITE_OTHER): Payer: BC Managed Care – PPO

## 2020-05-15 DIAGNOSIS — I10 Essential (primary) hypertension: Secondary | ICD-10-CM | POA: Diagnosis not present

## 2020-05-15 DIAGNOSIS — Z0000 Encounter for general adult medical examination without abnormal findings: Secondary | ICD-10-CM

## 2020-05-15 NOTE — Progress Notes (Signed)
Appointment Outcome:  Completed, Session #: 1  AGREEMENTS SECTION   Overall Goal(s): Stress management                                                 Agreement/Action Steps:  Take a lunch break away from computer between 12-12:30pm (maybe longer). Engage in music therapy with friend's mom on Sunday (playing piano and singing).  Incorporate a wind down before bed that includes listening to [place] on Pandora/YouTube. Monitor food eaten during periods of stress, especially sugar intake.   Progress Notes:  Patient stated that working her steps was going well until she had a major stressor with work. Patient stated that she had become extremely upset with the situation but was able to speak to her husband and friends for support. Patient also was able to check-in with herself to figure out how she felt and what she should do. Patient stated that she had a few days to process what happened and now she feels like she's back at baseline. Patient has also conducted some financial planning, which has helped reduce some anxiety as well.  Patient stated that her go to for coping was food and monitoring what she was eating stopped. Patient mentioned that she was able to take a lunch break daily. During this time, she took time away from the computer by changing locations in her home, and she would take drives to decompress.   Patient is now drinking more water and have walked on her treadmill Sunday and today. Patient did not incorporate Gospel music during wind down time before every night. At other times, patient spent wind down with husband watching tv.   Patient stated that she reached out to organize music therapy and have some additional planning to do with the pianist before she can start.    . Indicators of Success and Accountability:  Patient stated that not harboring bad feelings, being able to walk away from office are indicators of her success in stress management recently.   . Readiness: Patient is in action phase of stress management.  . Strengths and Supports: Patient has her husband and friends to support her. Patient stated that she is hopeful. . Challenges and Barriers: Patient is unsure what challenges she may encounter in the next two weeks.   Coaching Outcomes: Patient realized that by implementing her steps she was able to shorten the period of time that stressors affect her.   Patient stated that she wants to practice better responses to her stressors. Patient agreed to trying to walk on her treadmill during periods of stress to relax and refocus. Below are the outlined action steps for the next two weeks.  Continue to work on plan for music therapy Take a lunch break daily Incorporate a wind down time before bed - Gospel music or watch tv with husband Monitor food intake/Drink more water Walk on treadmill (especially during high periods of stress) Practice better responses Utilize support system - husband and friends Conduct self-check-ins: " How is Janal feeling in this moment?" and "What does Akiko need right now to help with this situation?"  Patient is interested in attending plays/shows at the [facility] to reduce stress in the future.  Patient is not interested in switching out snacks for healthier items at this time. However, the patient has a goal to lose weight.  Care Guide suggested a community resources for this goal ([facility] [place]). Patient is interested in information regarding the [place] for weight loss.    Attempted: Marland Kitchen Fulfilled #- Patient was able to take a lunch break away from computer,  . Partial #- Patient had a period where she did not monitor food intake. Patient is in planning process for music therapy. Patient attempted to incorporate music during wind down time.    Referrals: Care Guide will email patient link to the [facility].

## 2020-05-17 LAB — COMPREHENSIVE METABOLIC PANEL
ALT: 17 IU/L (ref 0–32)
AST: 26 IU/L (ref 0–40)
Albumin/Globulin Ratio: 1.5 (ref 1.2–2.2)
Albumin: 4 g/dL (ref 3.8–4.9)
Alkaline Phosphatase: 103 IU/L (ref 44–121)
BUN/Creatinine Ratio: 15 (ref 9–23)
BUN: 10 mg/dL (ref 6–24)
Bilirubin Total: 0.4 mg/dL (ref 0.0–1.2)
CO2: 23 mmol/L (ref 20–29)
Calcium: 8.9 mg/dL (ref 8.7–10.2)
Chloride: 101 mmol/L (ref 96–106)
Creatinine, Ser: 0.68 mg/dL (ref 0.57–1.00)
GFR calc Af Amer: 117 mL/min/{1.73_m2} (ref >59)
GFR calc non Af Amer: 102 mL/min/{1.73_m2} (ref >59)
Globulin, Total: 2.6 g/dL (ref 1.5–4.5)
Glucose: 58 mg/dL — ABNORMAL LOW (ref 65–99)
Potassium: 4.3 mmol/L (ref 3.5–5.2)
Sodium: 136 mmol/L (ref 134–144)
Total Protein: 6.6 g/dL (ref 6.0–8.5)

## 2020-05-17 LAB — LIPID PANEL
Chol/HDL Ratio: 3.3 ratio (ref 0.0–4.4)
Cholesterol, Total: 207 mg/dL — ABNORMAL HIGH (ref 100–199)
HDL: 62 mg/dL (ref >39)
LDL Chol Calc (NIH): 133 mg/dL — ABNORMAL HIGH (ref 0–99)
Triglycerides: 66 mg/dL (ref 0–149)
VLDL Cholesterol Cal: 12 mg/dL (ref 5–40)

## 2020-05-17 LAB — ALDOSTERONE + RENIN ACTIVITY W/ RATIO
ALDOS/RENIN RATIO: 5.9 (ref 0.0–30.0)
ALDOSTERONE: 2.3 ng/dL (ref 0.0–30.0)
Renin: 0.393 ng/mL/hr (ref 0.167–5.380)

## 2020-05-18 ENCOUNTER — Telehealth: Payer: Self-pay

## 2020-05-18 NOTE — Telephone Encounter (Signed)
VMT pt reference start of PREP class at [facility] T/TH [id number] on 05/30/20.  Requesting call back to do intake before class starts

## 2020-05-24 ENCOUNTER — Other Ambulatory Visit: Payer: Self-pay | Admitting: *Deleted

## 2020-05-24 DIAGNOSIS — I701 Atherosclerosis of renal artery: Secondary | ICD-10-CM

## 2020-05-24 DIAGNOSIS — I10 Essential (primary) hypertension: Secondary | ICD-10-CM

## 2020-05-25 ENCOUNTER — Telehealth: Payer: Self-pay

## 2020-05-25 DIAGNOSIS — Z0000 Encounter for general adult medical examination without abnormal findings: Secondary | ICD-10-CM

## 2020-05-25 NOTE — Telephone Encounter (Signed)
Called patient to rescheduled appointment per Care Guide due to conflict in schedule. Patient will call back today, May 25, 2020, with her availability for the week to reschedule.

## 2020-05-29 ENCOUNTER — Telehealth: Payer: Self-pay

## 2020-05-29 ENCOUNTER — Other Ambulatory Visit: Payer: Self-pay

## 2020-05-29 ENCOUNTER — Ambulatory Visit: Payer: BC Managed Care – PPO

## 2020-05-29 ENCOUNTER — Ambulatory Visit (INDEPENDENT_AMBULATORY_CARE_PROVIDER_SITE_OTHER): Payer: BC Managed Care – PPO | Admitting: Pharmacist

## 2020-05-29 VITALS — BP 140/88 | HR 71 | Resp 15 | Ht 64.0 in | Wt 186.0 lb

## 2020-05-29 DIAGNOSIS — E785 Hyperlipidemia, unspecified: Secondary | ICD-10-CM

## 2020-05-29 DIAGNOSIS — I10 Essential (primary) hypertension: Secondary | ICD-10-CM

## 2020-05-29 DIAGNOSIS — Z0000 Encounter for general adult medical examination without abnormal findings: Secondary | ICD-10-CM

## 2020-05-29 MED ORDER — AMLODIPINE BESYLATE 5 MG PO TABS: 5.0000 mg | ORAL_TABLET | Freq: Every day | ORAL | 1 refills | Status: DC

## 2020-05-29 NOTE — Progress Notes (Signed)
Patient ID: Krystal Bauer                 DOB: Jan 19, 1971                      MRN: [id number]     HPI: Krystal Bauer is a 50 y.o. female referred by Dr. Oval Linsey to Adv Hypertension clinic. PMH includes hypertension, hyperlipidemia, PCOS and fibroids. She struggles with social stress amd was referred to [name] (care navigator) to start coaching sections for stress management.  Assessment for secondary hypertension completed. Hyperaldosteronism ,renal artery stenosis, or thyroid disorder were ruled out. Losartan/HCTZ dose was increased from 50-12.5mg  to 100/25 mg daily during last OV with Dr Oval Linsey.  Repeat metabolic panel shows stable renal function and electrolytes within normal limits.   Patient presents for follow up. Stopped taking losartan/HCTZ d/t rash. Will like to avoid diuretic for now, she is under the impression that diuretic are making the LDL worst. Rosuvastatin not started yet, and only 1 visit for stress management completed so far.  Current HTN meds:  None  Previously tried:  Losartan/HCTZ 100-25mg  daily - not taking d/t rash and skin spilling  Losartan/HCTZ 50-12.5mg  daily - lack proper response HCTZ 12.5mg  daily - not effective  BP goal: <130/80  Family History:patient reports hypertension from mother and father.   Social History: rare alcohol use, no OTC supplements, no tobacco  Diet: manily home cooked meals, plant based diet, drinks 1 soda per day (occasioanl take-out). Her understanding is that gets less than 2000 mg of sodium per day. Looking at some of her pre-cooked or packed meal options ( 320mg  Just eggs, plantain chips, frozen meal 520mg , vegetables, cabbage from restaurant (unknown sodium content)t, 162mg  per bun) she considers possible to decrease sodium more on her diet.  Exercise: walking on treadmill 3-4 times per week for 1 mile, and stairs  Home BP readings:  Home BP reading (all while on losartan/HCTZ)  113/79 (75) - am  115/86  (78) - am  127/99 (71) - pm  128/93 (72) - pm, 116/86 (78) am  114/85 (70) pm  Wt Readings from Last 3 Encounters:  05/29/20 186 lb (84.4 kg)  04/17/20 192 lb (87.1 kg)  07/26/19 196 lb (88.9 kg)   BP Readings from Last 3 Encounters:  05/29/20 140/88  04/17/20 (!) 150/88  07/27/19 (!) 154/92   Pulse Readings from Last 3 Encounters:  05/29/20 71  04/17/20 71  07/27/19 72    Past Medical History:  Diagnosis Date  . Complication of anesthesia    pt feels she has prolonged effects of anes, lethargy and feels 'woozey"  . Essential hypertension 04/17/2020  . History of PCOS   . Medical history non-contributory   . Ovarian cyst     Current Outpatient Medications on File Prior to Visit  Medication Sig Dispense Refill  . acetaminophen (TYLENOL) 500 MG tablet Take 1,000 mg by mouth every 6 (six) hours as needed for headache.    . rosuvastatin (CRESTOR) 20 MG tablet Take 1 tablet (20 mg total) by mouth daily. (Patient not taking: Reported on 05/29/2020) 90 tablet 3  . [DISCONTINUED] promethazine (PHENERGAN) 25 MG tablet Take 1 tablet (25 mg total) by mouth every 8 (eight) hours as needed for nausea. (Patient not taking: Reported on 07/26/2019) 30 tablet 0   No current facility-administered medications on file prior to visit.    Allergies  Allergen Reactions  . Hyzaar [Losartan Potassium-Hctz] Rash  Rash, itchy   . Wheat Bran Nausea And Vomiting    Pt has a wheat sensitivity   . Latex Hives    Reaction to stretch elastic bandage  . Sulfa Antibiotics Other (See Comments)    Pt can not remember exact reaction; possible rash  . Sulfamethoxazole-Trimethoprim Other (See Comments)    Unknown allergic reaction    Blood pressure 140/88, pulse 71, resp. rate 15, height 5\' 4"  (1.626 m), weight 186 lb (84.4 kg), SpO2 99 %.  Essential hypertension Blood pressure remains above goal and patient is not taking any medication. We discussed possibilities to decrease sodium in diet. Will  start amlodipine 5mg  daily and follow up in 4 weeks. Patient understand we may need to have 2-3 agents to reach idea BP control, but will add only 1 agent at a time.    Hyperlipidemia Patient not taking rosuvastatin yet. She is under the impression, that HCTZ is causing LDL increased. Patient continued to have plant based diet. I encoiuraged her to start rosuvastatin at 1 tablet per week, then increase to 2 tablets per week, then continue to titrate as tolerated. Plan to avoid diuretic for now and repeat fasting blood work on 4-6 weeks.    Krystal Bauer PharmD, BCPS, CPP [place] [address] 05/31/2020 11:43 AM

## 2020-05-29 NOTE — Patient Instructions (Signed)
Return for a  follow up appointment in 4 weeks  Check your blood pressure at home daily (if able) and keep record of the readings.  Take your BP meds as follows: *STOP taking losartan/HCTZ* *START taking amlodipine 5mg  daily*  Bring all of your meds, your BP cuff and your record of home blood pressures to your next appointment.  Exercise as you're able, try to walk approximately 30 minutes per day.  Keep salt intake to a minimum, especially watch canned and prepared boxed foods.  Eat more fresh fruits and vegetables and fewer canned items.  Avoid eating in fast food restaurants.    HOW TO TAKE YOUR BLOOD PRESSURE: . Rest 5 minutes before taking your blood pressure. .  Don't smoke or drink caffeinated beverages for at least 30 minutes before. . Take your blood pressure before (not after) you eat. . Sit comfortably with your back supported and both feet on the floor (don't cross your legs). . Elevate your arm to heart level on a table or a desk. . Use the proper sized cuff. It should fit smoothly and snugly around your bare upper arm. There should be enough room to slip a fingertip under the cuff. The bottom edge of the cuff should be 1 inch above the crease of the elbow. . Ideally, take 3 measurements at one sitting and record the average.

## 2020-05-29 NOTE — Telephone Encounter (Signed)
Called patient to reschedule appointment from today because of conflict in schedule. Patient stated that she forgot to call back with her availability but would like to schedule her session for 05-30-20 at 12:00pm. Patient has been rescheduled for that date and time. Care Guide will call patient during scheduled visit.

## 2020-05-30 ENCOUNTER — Ambulatory Visit (INDEPENDENT_AMBULATORY_CARE_PROVIDER_SITE_OTHER): Payer: BC Managed Care – PPO

## 2020-05-30 DIAGNOSIS — Z0000 Encounter for general adult medical examination without abnormal findings: Secondary | ICD-10-CM

## 2020-05-30 NOTE — Progress Notes (Signed)
Appointment Outcome:  Completed, Session #: 2  AGREEMENTS SECTION   Overall Goal(s): Stress management                                              Agreement/Action Steps:  Continue to work on plan for music therapy Take a lunch break daily  Incorporate a wind down time before bed - Gospel music on Pandora/YouTube.or watch tv with husband Monitor food intake/Drink more water Walk on treadmill (especially during high periods of stress) Practice better responses Utilize support system - husband and friends Conduct self-check-ins  Progress Notes:  Patient stated that taking lunch breaks have been working well for her and it gives her a chance to walk away from work because it can be a lot. Leaving the environment gives her a sense of relief during the break. Patient stated that she has used the treadmill one time during a stress period, and it did help her relieve stress, but was not able to use the treadmill every time she was stressed. However, patient feels that this strategy is not sustainable over time and need a step that will be more beneficial in the moment. Patient reported that she has been utilizing her support system between her husband and friends. Patient stated that she talks to her best friend who allows her to vent while she listens, and sometimes offer advice on how to deal with situations.  Patient stated that she has been practicing better responses to stress. Patient mentioned that she finds herself closing her computer, taking deep breathes, or walking away when she is stressed. Patient stated that since the loss of a close friend/family member, she has been crying. Patient stated that there was a time that work was not coming together, and she had to reach a cutoff point because she was not being productive. Patient has been incorporating her wind down time by watching tv with her husband during the previous week. Patient stated that she has not been incorporating much of a  wind down since she's been off work for the past week because she has been resting/relaxing all day.  Patient met with PharmD yesterday and discussed cholesterol level and her salt intake. Patient stated that she must control her salt intake by not buying prepackaged meals/vegetables. Patient stated that she will start meal prepping on the weekends for M-F. Patient plans to buy containers so she can pack her weekly meals. Patient stated that she really does not monitor her water intake. However, she sits a gallon container by her computer to drink on throughout the day while working. Patient stated that this does not happen every day. Her main challenge has been that she engages in this behavior during work and have been off work for a week and it has slipped her mind. In the past week out of the office, the patient has been busy and trying to get outside more but have continued to drink water, but not sure how much.   Patient has not been able to work on music therapy planning in the past two weeks. Patient has not actively conducted a self-check-in.    . Indicators of Success and Accountability:  Being able to practice different responses to stressors by having a cutoff point, boundaries, and knowing enough is enough.  . Readiness: Patient is in the action phase of stress management. . Strengths  and Supports: Patient has her husband and best friend as great supports. Patient feels that she is focused when it comes to implementing her action steps. . Challenges and Barriers: Everyday stress is the challenge that the patient identified as a challenge to implementing her steps.   Coaching Outcomes: Patient will hold off on walking on treadmill during stressful periods of times but will continue to incorporate it as an overall stress management technique during any time of the patient's choosing.   Patient will start monitoring how much water she consumes daily, and how much salt she consumes by being  mindful of the foods she purchases and meal prepping for the week to reduce sodium intake. Patient will continue to implement all other action steps as outlined above.   Patient is interested in learning more about the stress journal activity. Care Guide will email patient information on this strategy to manage stress.   The patient's takeaway from today's session is that she is trying to change things, but she can't keep doing what she has been doing and expect to get a different result when stressed. Patient has also been able to recognize when the stress has passed, and she starts to feel better.    Attempted: Marland Kitchen Fulfilled #- Patient is utilizing support system regularly. Patient has practiced better responses to stress.  . Partial #- Patient has been able to incorporate a lunch break for a week during work. Patient utilized the treadmill during one stressful period in the past two weeks. Patient has started monitoring food/water intake. Patient has incorporated a wind down before bed with husband for a week because of work schedule.  Not met #- Patient is not actively conducting self-check-ins to analyze how she is feeling during moments of stress and determining what she needs in the moment to calm down. Patient has not work towards music therapy planning.  Not Attempted: Marland Kitchen Dropped/Revised #- Patient will not use treadmill during periods of stress because she feels that it is not sustainable over time.

## 2020-05-31 DIAGNOSIS — E785 Hyperlipidemia, unspecified: Secondary | ICD-10-CM | POA: Insufficient documentation

## 2020-05-31 NOTE — Assessment & Plan Note (Signed)
Blood pressure remains above goal and patient is not taking any medication. We discussed possibilities to decrease sodium in diet. Will start amlodipine 5mg  daily and follow up in 4 weeks. Patient understand we may need to have 2-3 agents to reach idea BP control, but will add only 1 agent at a time.

## 2020-05-31 NOTE — Assessment & Plan Note (Signed)
Patient not taking rosuvastatin yet. She is under the impression, that HCTZ is causing LDL increased. Patient continued to have plant based diet. I encoiuraged her to start rosuvastatin at 1 tablet per week, then increase to 2 tablets per week, then continue to titrate as tolerated. Plan to avoid diuretic for now and repeat fasting blood work on 4-6 weeks.

## 2020-06-12 ENCOUNTER — Ambulatory Visit: Payer: BC Managed Care – PPO

## 2020-06-12 NOTE — Progress Notes (Signed)
Patient was not able to keep scheduled appointment for today, 06/12/20 at 12:00 pm. Patient has been rescheduled for a telephone session for 06/14/20 at 1:30pm.

## 2020-06-14 ENCOUNTER — Ambulatory Visit (INDEPENDENT_AMBULATORY_CARE_PROVIDER_SITE_OTHER): Payer: BC Managed Care – PPO

## 2020-06-14 ENCOUNTER — Other Ambulatory Visit: Payer: Self-pay

## 2020-06-14 DIAGNOSIS — Z0000 Encounter for general adult medical examination without abnormal findings: Secondary | ICD-10-CM

## 2020-06-14 NOTE — Progress Notes (Unsigned)
Appointment Outcome:  Completed, Session #: 3  AGREEMENTS SECTION  Overall Goal(s): Stress management Reduce sodium intake                                               Agreement/Action Steps:  Take a lunch break daily  Incorporate a wind down time before bed - Gospel music on Pandora/YouTube or watch tv with husband Monitor food intake/Drink more water Practice better responses Utilize support system - husband and friends Conduct self-check-ins  Progress Notes:  Patient has incorporated a lunch break daily to get away from the computer. Some days she would get out the house and drive somewhere. This strategy has helped manage stress from work. Although there are ongoing issues at work, the patient has been practicing better responses to circumstances that leaves her feeling disrespected and devalued. 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 Patient stated it makes her feel like she is accomplishing what she wants for herself.  Patient stated that she has to keep in mind that things are what they are and that having the expectation that others while change or a situation will get better, she was only hurting herself. She mentioned that how she views and respond to things is essential. Now she is funneling this energy into something else, which is helping her get through work.   Patient stated that she was feeling overwhelmed from the passing of her friend/family member and was able to recognize this when she conducted a self-check-in. Patient Stated that upon recognizing this, she had to pull back her support a little to implement self-care. Utilizing her support system has been helpful in stress management. She has a texting circle with her close friends that are in contact almost every day. Patient stated that she  incorporated a wind down before bed a few times. She stated that majority of the time she falls straight to sleep.   Patient is managing stress eating and her sodium intake via meal prepping. Patient stated that this strategy is working out well and she feels empowered because she can control this. She already has her food packaged in containers, which makes incorporating lunch easy as well. She typically meal prep for 5-6 days and have a free day. Patient stated that she continues to eat the [employer] cookies as a snack to minimize her sugar intake. Patient stated that this strategy she believes that she can continue as a lifestyle change.   Patient stated that she appreciated the information on the stress journal technique but do not see it is as an action step that she can implement at this time.   . Indicators of Success and Accountability:  Patient stated that meal prepping is her indicator of success and held her accountable for monitoring food intake.  . Readiness: Patient is in the action phase of stress management and monitoring sodium intake.   . Strengths and Supports: Patient feels that she has been relentless and demonstrated perseverance over the past two weeks.  . Challenges and Barriers: Patient's work environment and grief may be challenges in the next two weeks to stress management.  Coaching Outcomes: Patient will continue to implement action steps  as outlined above. Patient will continue to meal prep to monitor her food intake and sodium in foods. Patient will continue to work on project to balance the stress she experiences from her job because this brings her positive energy.  Patient will not use the stress journal as an action step towards stress management.   Patient's takeaway from today's session is that all hope is not lost. Patient stated that she is working to find her path, to stay positive, and believe she has a purpose. Patient stated that working on stress management is  something she wants to get a handle on to prevent sickness and disease. Patient sated that she was looking at things through the lens of her challenges because of what she's been through, but this gradually shifting.   Attempted: Marland Kitchen Fulfilled - Patient took a daily lunch break, monitor food intake/drink more water, has been practicing positive responses, utilize support system regularly, and conduct self-check-ins as agreed upon.  Partial - Patient incorporated a wind down time before bed a few times in the past two weeks.

## 2020-06-21 ENCOUNTER — Other Ambulatory Visit: Payer: Self-pay | Admitting: Cardiovascular Disease

## 2020-06-26 ENCOUNTER — Other Ambulatory Visit: Payer: Self-pay

## 2020-06-26 ENCOUNTER — Ambulatory Visit (INDEPENDENT_AMBULATORY_CARE_PROVIDER_SITE_OTHER): Payer: BC Managed Care – PPO

## 2020-06-26 DIAGNOSIS — Z0000 Encounter for general adult medical examination without abnormal findings: Secondary | ICD-10-CM

## 2020-06-26 NOTE — Progress Notes (Signed)
Appointment Outcome:  Completed, Session #: 4  AGREEMENTS SECTION   Overall Goal(s): Stress management Reduce sodium intake                                               Agreement/Action Steps:  Take a lunch break daily  Incorporate a wind down time before bed - Gospel music on Pandora/YouTube or watch tv with husband Meal prepping/Reading food labels  Monitor water consumption Practice better responses Utilize support system - husband and friends Conduct self-check-ins  Progress Notes:  Patient reported being able to take a lunch every day with no issues. Patient reported that her stress level has increased because of an incident that has occurred in her work life. Patient reported feeling disrespected and as if she has hit a wall full speed.  Patient reached out to her support system to aid in processing her emotions and making a plan moving forward. Patient feels supported by husband even more now that he knows what she is dealing with. Patient has practiced a different response to her stressors with this approach.  Patient has been conducting self-check-ins throughout the day to help her process how she is feeling and what she needs in moments of stress. Patient is incorporating wind down time before bed every night.  Patient has started meal prepping to help monitor her food consumption and reading food labels to monitor her sodium intake. Patient shared that meal prepping has helped her notice how much she eats by practicing portion control with her containers. Patient reported that she has been drinking water, but unsure how much. She plans to pay attention to the amount of water she drinks daily to determine if she wants to increase the number of oz per day.    . Indicators of Success and Accountability:  Patient stated that she was implementing her action steps with no issues prior to work concerns but was able to remain accountable by utilizing her support system.  . Readiness:  Patient is in the action phase of stress management and reducing sodium intake.  . Strengths and Supports: Patient is being supported by friends and husband. Being opened to leaning on her support system has been her strength in the past two weeks.  . Challenges and Barriers: Patient is having challenges with work life, which may impede ability to focus on implementing action steps.  Coaching Outcomes: Patient will prioritize health (mental and emotional) by taking extra time to shut down everything when she recognizes that enough is enough for that moment when she conducts a self-check-in. During this time the patient plans to walk away for a moment or take a drive to relax.  Patient will start monitoring her water intake to determine how much water she drinks daily and if she needs to increase the amount of oz she drinks.  Patient will continue to meal prep for the week and read food labels to monitor her food consumption and sodium intake.  Patient will continue to implement all other action steps as outlined above.  Attempted: Marland Kitchen Fulfilled - Patient has been able to take a lunch break every day without problems. Patient is incorporating a wind down before bed. Patient has been monitoring her food intake to reduce sodium by meal prepping. Patient is practicing better responses to stress(ors). Patient is utilizing her support system more and conduct self-check-ins throughout  the day.

## 2020-07-03 ENCOUNTER — Ambulatory Visit: Payer: BC Managed Care – PPO

## 2020-07-03 ENCOUNTER — Telehealth: Payer: Self-pay

## 2020-07-03 NOTE — Progress Notes (Deleted)
Patient ID: Krystal Bauer                 DOB: 1970/06/16                      MRN: [id number]     HPI: Krystal Bauer is a 50 y.o. female referred by Dr. Oval Linsey to Adv Hypertension clinic. PMH includes hypertension, hyperlipidemia, PCOS and fibroids. She struggles with social stress amd was referred to [name] (care navigator) to start coaching sections for stress management.  Assessment for secondary hypertension completed. Hyperaldosteronism ,renal artery stenosis, or thyroid disorder were ruled out.    Current HTN meds:  amlodipine 5mg  daily  Previously tried:  Losartan/HCTZ 100-25mg  daily - not taking d/t rash and skin spilling  Losartan/HCTZ 50-12.5mg  daily - lack proper response HCTZ 12.5mg  daily - not effective  BP goal: <130/80  Family History:patient reports hypertension from mother and father.   Social History: rare alcohol use, no OTC supplements, no tobacco  Diet: manily home cooked meals, plant based diet, drinks 1 soda per day (occasioanl take-out). Her understanding is that gets less than 2000 mg of sodium per day. Looking at some of her pre-cooked or packed meal options ( 320mg  Just eggs, plantain chips, frozen meal 520mg , vegetables, cabbage from restaurant (unknown sodium content)t, 162mg  per bun) she considers possible to decrease sodium more on her diet.  Exercise: walking on treadmill 3-4 times per week for 1 mile, and stairs  Home BP readings:    Wt Readings from Last 3 Encounters:  05/29/20 186 lb (84.4 kg)  04/17/20 192 lb (87.1 kg)  07/26/19 196 lb (88.9 kg)   BP Readings from Last 3 Encounters:  05/29/20 140/88  04/17/20 (!) 150/88  07/27/19 (!) 154/92   Pulse Readings from Last 3 Encounters:  05/29/20 71  04/17/20 71  07/27/19 72    Past Medical History:  Diagnosis Date  . Complication of anesthesia    pt feels she has prolonged effects of anes, lethargy and feels 'woozey"  . Essential hypertension 04/17/2020  . History  of PCOS   . Medical history non-contributory   . Ovarian cyst     Current Outpatient Medications on File Prior to Visit  Medication Sig Dispense Refill  . acetaminophen (TYLENOL) 500 MG tablet Take 1,000 mg by mouth every 6 (six) hours as needed for headache.    Marland Kitchen amLODipine (NORVASC) 5 MG tablet TAKE 1 TABLET (5 MG TOTAL) BY MOUTH DAILY. 30 tablet 1  . rosuvastatin (CRESTOR) 20 MG tablet Take 1 tablet (20 mg total) by mouth daily. (Patient not taking: Reported on 05/29/2020) 90 tablet 3  . [DISCONTINUED] promethazine (PHENERGAN) 25 MG tablet Take 1 tablet (25 mg total) by mouth every 8 (eight) hours as needed for nausea. (Patient not taking: Reported on 07/26/2019) 30 tablet 0   No current facility-administered medications on file prior to visit.    Allergies  Allergen Reactions  . Hyzaar [Losartan Potassium-Hctz] Rash    Rash, itchy   . Wheat Bran Nausea And Vomiting    Pt has a wheat sensitivity   . Latex Hives    Reaction to stretch elastic bandage  . Sulfa Antibiotics Other (See Comments)    Pt can not remember exact reaction; possible rash  . Sulfamethoxazole-Trimethoprim Other (See Comments)    Unknown allergic reaction    There were no vitals taken for this visit.  No problem-specific Assessment & Plan notes found for this encounter.  [name] PharmD, BCPS, [facility] [address] [place] [address] 07/03/2020 11:30 AM

## 2020-07-03 NOTE — Telephone Encounter (Signed)
LMOM FOR MISSED APPT TO R/S

## 2020-07-10 ENCOUNTER — Other Ambulatory Visit: Payer: Self-pay

## 2020-07-10 ENCOUNTER — Ambulatory Visit (INDEPENDENT_AMBULATORY_CARE_PROVIDER_SITE_OTHER): Payer: BC Managed Care – PPO

## 2020-07-10 DIAGNOSIS — Z0000 Encounter for general adult medical examination without abnormal findings: Secondary | ICD-10-CM

## 2020-07-10 NOTE — Progress Notes (Signed)
Appointment Outcome:  Completed, Session #: 5  AGREEMENTS SECTION  Overall Goal(s): Stress management Reduce sodium intake                                               Agreement/Action Steps:  Take a lunch break daily  Incorporate a wind down time before bed - Gospel music on Pandora/YouTube or watch tv with husband Meal prepping/Reading food labels  Monitor water consumption Practice better responses Utilize support system - husband and friends Conduct self-check-ins  Progress Notes:  Patient stated that taking a lunch break has become a long-term behavior she will be implementing. Patient simply closes her computer and move away from the area to do something else. Patient stated that she attempted to practice mental/emotional breaks, but the frustration got the best of her. Patient reported that she is working on this. Patient shared an experience that didn't go as she had planned that she stated she could have responded differently to. However, after the incident, she reached out for support and had the opportunity evaluate things and came away with a different perspective.  Patient stated that moving forward she will pull back from stressful/frustrating situations/people, let off steam privately, take a moment to reflect, then decide on the next best steps to determine the best response. Patient realized through these practices she has been conducting self-check-ins to recognize how she is feeling at a given time and determine what she needs in that moment. Patient shared that she is incorporating wind down time on most days and that sometimes it is helpful. She finds that she may still be a little anxious because her mind is working on what's bothering her and she can't always separate her head from it completely. Patient stated that she really doesn't have trouble sleeping at night because of this issue.   Patient has not monitored precisely how much water she has been drinking per day,  but ensures that she is drinking as much water as possible. Patient is enjoying meal prepping and stated that is helps reduce stress. Patient typically meal prep on the weekends and wait until the food gets low to meal prep again and then meal prep a lot to last for a few more days. Patient has found it easy to maintain this routine. Patient shared that she is reading food labels and tries to stick to the recommendations. However, this is challenging when she eats out on weekends because she doesn't know all that's in the food. Patient stated that she will be out of town for work for a week and will be faced with the same challenge.    . Indicators of Success and Accountability:  Patient stated that being more aware of her role in why she is stress is her indicator of success and accountability. . Readiness: Patient is in the action phase of stress management and reducing sodium intake.  . Strengths and Supports: Patient has husband and friends for support. Patient stated that perseverance has been her strength over the course of these two weeks.  . Challenges and Barriers: Stress from job can be a challenge to managing stress overall.  Coaching Outcomes: Patient will research restaurant's menus in the area she will be staying during the week she's out of town for work to make the healthiest food choices. Patient stated that she will ask to not have  salt added to her food.   Patient decided not to monitor how much water she drinks throughout the day, but to ensure that she is drinking water in general.   Patient will try Yoga on the weekend to see how it feels/benefits from it to help relax her mind to help relax her mind from ruminating about things that are bothering her and making her anxious.  Below is an outline of the action steps for the next two weeks.    Agreement/Action Steps: Take a lunch break daily Practice mental/emotional breaks Practice better responses Conduct  self-check-ins Utilize support system - husband and friends Incorporate a wind down time before bed Try Yoga over the weekend to see how it feels/benefits of clearing mind ----------------------------------------------------------------------------------------------- Meal prep throughout week Read food labels Research restaurants' menus for healthy food options/ Ask to not add salt to food   Patient shared that her biggest takeaway from this session is that in the beginning she felt like things were a runaway train and that she didn't have a plan. Now she is thinking about how to manage stress differently. She sees where she still have some power in situations. Additionally, she has started to analyze her responses to stress, what she has done to add to or cause a stressful situation to occur.    Attempted: Marland Kitchen Fulfilled - Patient has taken a lunch break daily, meal preps throughout the week and reading food labels, utilizes her support system, and conduct self-check-ins. . Partial - Patient incorporates wind downs before bed most nights, has tried practicing mental/emotional breaks along with practicing better responses.    Not attempted: Marland Kitchen Dropped/Revised - Monitor water consumption - Patient determined this was not important to address at this time.

## 2020-07-24 ENCOUNTER — Ambulatory Visit: Payer: BC Managed Care – PPO

## 2020-08-10 ENCOUNTER — Telehealth: Payer: Self-pay

## 2020-08-10 NOTE — Telephone Encounter (Signed)
LVMT requesting call back to discuss PREP class starting on 08/29/20 at [facility] T/TH [id number]

## 2020-08-14 ENCOUNTER — Other Ambulatory Visit (HOSPITAL_BASED_OUTPATIENT_CLINIC_OR_DEPARTMENT_OTHER): Payer: Self-pay

## 2020-08-14 ENCOUNTER — Ambulatory Visit: Payer: BC Managed Care – PPO | Attending: Internal Medicine

## 2020-08-14 ENCOUNTER — Other Ambulatory Visit: Payer: Self-pay

## 2020-08-14 DIAGNOSIS — Z23 Encounter for immunization: Secondary | ICD-10-CM

## 2020-08-14 MED ORDER — PFIZER-BIONT COVID-19 VAC-TRIS 30 MCG/0.3ML IM SUSP
INTRAMUSCULAR | 0 refills | Status: DC
  Filled 2020-08-14: qty 0.3, 1d supply, fill #0

## 2020-08-14 NOTE — Progress Notes (Signed)
   Covid-19 Vaccination Clinic  Name:  Krystal Bauer    MRN: [id number] DOB: 05-03-1970  08/14/2020  Krystal Bauer was observed post Covid-19 immunization for 15 minutes without incident. She was provided with Vaccine Information Sheet and instruction to access the V-Safe system.   Krystal Bauer was instructed to call 911 with any severe reactions post vaccine: Marland Kitchen Difficulty breathing  . Swelling of face and throat  . A fast heartbeat  . A bad rash all over body  . Dizziness and weakness   Immunizations Administered    Name Date Dose VIS Date Route   PFIZER Comrnaty(Gray TOP) Covid-19 Vaccine 08/14/2020  1:43 PM 0.3 mL 03/16/2020 Intramuscular   Manufacturer: [place]   Lot: [id number]   NDC: [phone]

## 2020-08-23 ENCOUNTER — Telehealth: Payer: Self-pay

## 2020-08-23 DIAGNOSIS — Z0000 Encounter for general adult medical examination without abnormal findings: Secondary | ICD-10-CM

## 2020-08-23 NOTE — Telephone Encounter (Signed)
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 Patient stated that she is about to start a new job and do not know how she will be able to fit it into her schedule. 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

## 2020-10-30 ENCOUNTER — Telehealth: Payer: Self-pay

## 2020-10-30 NOTE — Telephone Encounter (Signed)
Attempted to reach reference next evening PREP class starting in August. Unable to leave a message due to mailbox being full

## 2021-01-04 DIAGNOSIS — E663 Overweight: Secondary | ICD-10-CM | POA: Diagnosis not present

## 2021-01-04 DIAGNOSIS — Z13220 Encounter for screening for lipoid disorders: Secondary | ICD-10-CM | POA: Diagnosis not present

## 2021-01-04 DIAGNOSIS — Z0000 Encounter for general adult medical examination without abnormal findings: Secondary | ICD-10-CM | POA: Diagnosis not present

## 2021-01-04 DIAGNOSIS — Z23 Encounter for immunization: Secondary | ICD-10-CM | POA: Diagnosis not present

## 2021-01-04 DIAGNOSIS — I10 Essential (primary) hypertension: Secondary | ICD-10-CM | POA: Diagnosis not present

## 2021-02-01 ENCOUNTER — Other Ambulatory Visit: Payer: Self-pay

## 2021-02-01 ENCOUNTER — Other Ambulatory Visit (HOSPITAL_BASED_OUTPATIENT_CLINIC_OR_DEPARTMENT_OTHER): Payer: Self-pay

## 2021-02-01 ENCOUNTER — Ambulatory Visit: Payer: Self-pay | Attending: Internal Medicine

## 2021-02-01 DIAGNOSIS — Z23 Encounter for immunization: Secondary | ICD-10-CM

## 2021-02-01 MED ORDER — PFIZER COVID-19 VAC BIVALENT 30 MCG/0.3ML IM SUSP
INTRAMUSCULAR | 0 refills | Status: DC
  Filled 2021-02-01: qty 0.3, 1d supply, fill #0

## 2021-02-01 NOTE — Progress Notes (Signed)
   Covid-19 Vaccination Clinic  Name:  Krystal Bauer    MRN: [id number] DOB: 1970/08/18  02/01/2021  [name] was observed post Covid-19 immunization for 15 minutes without incident. She was provided with Vaccine Information Sheet and instruction to access the V-Safe system.   [name] was instructed to call 911 with any severe reactions post vaccine: Difficulty breathing  Swelling of face and throat  A fast heartbeat  A bad rash all over body  Dizziness and weakness   Immunizations Administered     Name Date Dose VIS Date Route   Pfizer Covid-19 Vaccine Bivalent Booster 02/01/2021 11:06 AM 0.3 mL 12/06/2020 Intramuscular   Manufacturer: [place]   Lot: [id number]   [place]: [phone]

## 2021-03-12 DIAGNOSIS — Z01419 Encounter for gynecological examination (general) (routine) without abnormal findings: Secondary | ICD-10-CM | POA: Diagnosis not present

## 2021-03-12 DIAGNOSIS — D251 Intramural leiomyoma of uterus: Secondary | ICD-10-CM | POA: Diagnosis not present

## 2021-03-12 DIAGNOSIS — N951 Menopausal and female climacteric states: Secondary | ICD-10-CM | POA: Diagnosis not present

## 2021-03-12 DIAGNOSIS — I10 Essential (primary) hypertension: Secondary | ICD-10-CM | POA: Diagnosis not present

## 2021-04-06 DIAGNOSIS — Z1231 Encounter for screening mammogram for malignant neoplasm of breast: Secondary | ICD-10-CM | POA: Diagnosis not present

## 2021-04-11 NOTE — Progress Notes (Signed)
Advanced Hypertension Clinic Follow-up:    Date:  04/12/2021   ID:  Krystal Bauer, DOB 28-May-1970, MRN [id number]  PCP:  Seward Carol, MD  Cardiologist:  None  Nephrologist:  Referring MD: Seward Carol, MD   CC: Hypertension  History of Present Illness:    Krystal Bauer is a 51 y.o. female with a hx of hypertension, hyperlipidemia, PCOS and fibroids here follow-up. She initially established care in the advanced hypertension clinic 04/17/2020. April 2021 she had a uterine fibroid procedure.  Following the procedure she struggled with headaches.  She checked her BP and it was 190s/100s.  She went to the ED and it was 200/118.  She was started on HCTZ and losartan was subsequently added. She was referred to PREP. Renal artery dopplers 04/2020 revealed mild blockage on the right and none on the left. Renin and aldosterone were within normal limits. Thyroid function was normal. Losartan/HCTZ was increased, however she stopped taking it due to a rash. When she saw our pharmacist, she was not taking any medicines and was started on amlodipine. She was also started on rosuvastatin for her lipids.  Today, she states thinking she was doing well but her cholesterol has increased. She had lost some weight, but in 02/2021 her cholesterol was elevated (LDL 202). She confirms that she did not start taking Crestor at the time of her lab work. Generally she has been following a pescatarian diet. She successfully lost weight with the [employer] 2 [employer] online program, intermittent fasting with low carbs, as well as 2-3 days of exercise a week. At home her blood pressure had been averaging in the 195K systolic, with more elevated diastolic readings. She notes that she has had breakouts on her skin since stopping Losartan/HCTZ. She is unsure if the medicine is the cause of her rash. Lately she has started a new job, but this is still causing a high amount of stress. She denies any palpitations, chest  pain, or shortness of breath. No lightheadedness, headaches, syncope, orthopnea, PND, lower extremity edema or exertional symptoms.  Previous antihypertensives: none   Past Medical History:  Diagnosis Date   Complication of anesthesia    pt feels she has prolonged effects of anes, lethargy and feels 'woozey"   Essential hypertension 04/17/2020   History of PCOS    Medical history non-contributory    Ovarian cyst     Past Surgical History:  Procedure Laterality Date   DILATATION & CURRETTAGE/HYSTEROSCOPY WITH RESECTOCOPE N/A 12/03/2012   Procedure: DILATATION & CURETTAGE/HYSTEROSCOPY WITH RESECTION OF ENDOMETRIAL POLYP and fibroid;  Surgeon: Ena Dawley, MD;  Location: [facility];  Service: Gynecology;  Laterality: N/A;   DILATION AND CURETTAGE OF UTERUS     x3   IR ANGIOGRAM PELVIS SELECTIVE OR SUPRASELECTIVE  07/12/2019   IR ANGIOGRAM PELVIS SELECTIVE OR SUPRASELECTIVE  07/12/2019   IR ANGIOGRAM SELECTIVE EACH ADDITIONAL VESSEL  07/12/2019   IR ANGIOGRAM SELECTIVE EACH ADDITIONAL VESSEL  07/12/2019   IR EMBO TUMOR ORGAN ISCHEMIA INFARCT INC GUIDE ROADMAPPING  07/12/2019   IR RADIOLOGIST EVAL & MGMT  04/22/2019   IR RADIOLOGIST EVAL & MGMT  08/10/2019   IR RADIOLOGIST EVAL & MGMT  02/16/2020   IR US GUIDE VASC ACCESS RIGHT  07/12/2019   OVARIAN CYST SURGERY     WISDOM TOOTH EXTRACTION      Current Medications: Current Meds  Medication Sig   COVID-19 mRNA bivalent vaccine, Pfizer, (PFIZER COVID-19 VAC BIVALENT) injection Inject into the muscle.   COVID-19  mRNA Vac-TriS, Pfizer, (PFIZER-BIONT COVID-19 VAC-TRIS) SUSP injection Inject into the muscle.   rosuvastatin (CRESTOR) 10 MG tablet Take 10 mg by mouth daily.   valsartan-hydrochlorothiazide (DIOVAN HCT) 160-25 MG tablet Take 1 tablet by mouth daily.   [DISCONTINUED] losartan-hydrochlorothiazide (HYZAAR) 50-12.5 MG tablet Take 1 tablet by mouth daily.     Allergies:   Hyzaar [losartan potassium-hctz], Wheat bran, Latex, Sulfa  antibiotics, and Sulfamethoxazole-trimethoprim   Social History   Socioeconomic History   Marital status: Single    Spouse name: Not on file   Number of children: Not on file   Years of education: Not on file   Highest education level: Not on file  Occupational History   Not on file  Tobacco Use   Smoking status: Never   Smokeless tobacco: Never  Vaping Use   Vaping Use: Never used  Substance and Sexual Activity   Alcohol use: Yes    Comment: socially    Drug use: No   Sexual activity: Not Currently    Birth control/protection: Abstinence  Other Topics Concern   Not on file  Social History Narrative   Not on file   Social Determinants of Health   Financial Resource Strain: Low Risk    Difficulty of Paying Living Expenses: Not hard at all  Food Insecurity: No Food Insecurity   Worried About Charity fundraiser in the Last Year: Never true   Ran Out of Food in the Last Year: Never true  Transportation Needs: No Transportation Needs   Lack of Transportation (Medical): No   Lack of Transportation (Non-Medical): No  Physical Activity: Sufficiently Active   Days of Exercise per Week: 3 days   Minutes of Exercise per Session: 60 min  Stress: No Stress Concern Present   Feeling of Stress : Not at all  Social Connections: Not on file     Family History: The patient's family history includes Asthma in her mother; Diabetes in her father and mother; Heart Problems in her father; Hypertension in her father and mother; Parkinson's disease in her mother; Prostate cancer in her brother.  ROS:   Please see the history of present illness.    (+) Stress (+) Skin rash All other systems reviewed and are negative.  EKGs/Labs/Other Studies Reviewed:    Bilateral Renal Artery Duplex 05/08/2020: Summary:  Largest Aortic Diameter: 2.1 cm     Renal:     Right: Normal size right kidney. Normal right Resistive Index.         Normal cortical thickness of right kidney. 1-59% stenosis  of         the right renal artery. RRV flow present.  Left:  Normal size of left kidney. Normal left Resistive Index.         Normal cortical thickness of the left kidney. No evidence of         left renal artery stenosis. LRV flow present.  Mesenteric:  Normal Celiac artery and Superior Mesenteric artery findings.     Patent IVC.   EKG:   04/12/2021: EKG was not ordered. 04/17/2020: sinus rhythm.  Rate 71 bpm.   Recent Labs: 04/26/2020: TSH 2.790 05/15/2020: ALT 17; BUN 10; Creatinine, Ser 0.68; Potassium 4.3; Sodium 136   Recent Lipid Panel    Component Value Date/Time   CHOL 207 (H) 05/15/2020 1107   TRIG 66 05/15/2020 1107   HDL 62 05/15/2020 1107   CHOLHDL 3.3 05/15/2020 1107   CHOLHDL 3.5 02/24/2012 1027   VLDL 13  02/24/2012 1027   LDLCALC 133 (H) 05/15/2020 1107    Physical Exam:    VS:  BP 134/82 (BP Location: Left Arm, Patient Position: Sitting, Cuff Size: Normal)    Pulse 62    Ht 5\' 4"  (1.626 m)    Wt 172 lb 9.6 oz (78.3 kg)    BMI 29.63 kg/m  , BMI Body mass index is 29.63 kg/m. GENERAL:  Well appearing HEENT: Pupils equal round and reactive, fundi not visualized, oral mucosa unremarkable NECK:  No jugular venous distention, waveform within normal limits, carotid upstroke brisk and symmetric, no bruits LUNGS:  Clear to auscultation bilaterally HEART:  RRR.  PMI not displaced or sustained,S1 and S2 within normal limits, no S3, no S4, no clicks, no rubs, no murmurs ABD:  Flat, positive bowel sounds normal in frequency in pitch, no bruits, no rebound, no guarding, no midline pulsatile mass, no hepatomegaly, no splenomegaly EXT:  2 plus pulses throughout, no edema, no cyanosis no clubbing SKIN:  No rashes no nodules NEURO:  Cranial nerves II through XII grossly intact, motor grossly intact throughout PSYCH:  Cognitively intact, oriented to person place and time   ASSESSMENT:    1. Essential hypertension   2. Hyperlipidemia, unspecified hyperlipidemia type       PLAN:    Essential hypertension Blood pressure is better controlled but still slightly above goal.  She still has a lot of her current dose of medicine.  When she refills that she will get 100/12.5 mg.  She will check a CMP a week after that.  She is congratulated on her excellent dietary and lifestyle changes.  Hyperlipidemia Her lipids worsen despite working on exercise and diet.  Her LDL is now 202.  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  I am happy that she is now on rosuvastatin.  We will check lipids and a CMP in about a month.  We will also get a coronary calcium score to better understand her LDL goal and overall risk.    Disposition:    FU with Merita Hawks C. Oval Linsey, MD, [facility] in 3 months   Medication Adjustments/Labs and Tests Ordered: Current medicines are reviewed at length with the patient today.  Concerns regarding medicines are outlined above.   Orders Placed This Encounter  Procedures   CT CARDIAC SCORING (SELF PAY ONLY)   Comprehensive metabolic panel   Lipid panel   Meds ordered this encounter  Medications   valsartan-hydrochlorothiazide (DIOVAN HCT) 160-25 MG tablet    Sig: Take 1 tablet by mouth daily.    Dispense:  90 tablet    Refill:  1    D/C LOSARTAN HCT   I,Mathew Stumpf,acting as a scribe for Skeet Latch, MD.,have documented all relevant documentation on the behalf of Skeet Latch, MD,as directed by  Skeet Latch, MD while in the presence of Skeet Latch, MD.  I, Roscoe Oval Linsey, MD have reviewed all documentation for this visit.  The documentation of the exam, diagnosis, procedures, and orders on 04/12/2021 are all accurate and complete.   Signed, Skeet Latch, MD  04/12/2021 1:03 PM    [place]

## 2021-04-12 ENCOUNTER — Ambulatory Visit (INDEPENDENT_AMBULATORY_CARE_PROVIDER_SITE_OTHER): Payer: BC Managed Care – PPO | Admitting: Cardiovascular Disease

## 2021-04-12 ENCOUNTER — Encounter (HOSPITAL_BASED_OUTPATIENT_CLINIC_OR_DEPARTMENT_OTHER): Payer: Self-pay | Admitting: Cardiovascular Disease

## 2021-04-12 ENCOUNTER — Other Ambulatory Visit: Payer: Self-pay

## 2021-04-12 DIAGNOSIS — I10 Essential (primary) hypertension: Secondary | ICD-10-CM | POA: Diagnosis not present

## 2021-04-12 DIAGNOSIS — E785 Hyperlipidemia, unspecified: Secondary | ICD-10-CM

## 2021-04-12 MED ORDER — VALSARTAN-HYDROCHLOROTHIAZIDE 160-25 MG PO TABS: 1.0000 | ORAL_TABLET | Freq: Every day | ORAL | 1 refills | Status: DC

## 2021-04-12 NOTE — Assessment & Plan Note (Signed)
Her lipids worsen despite working on exercise and diet.  Her LDL is now 202.  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  I am happy that she is now on rosuvastatin.  We will check lipids and a CMP in about a month.  We will also get a coronary calcium score to better understand her LDL goal and overall risk.

## 2021-04-12 NOTE — Patient Instructions (Signed)
Medication Instructions:  WHEN YOU FINISH YOUR LOSARTAN HCT START VALSARTAN HCT 160-25 MG DAILY   Labwork: FASTING LP/CMET 1 WEEK AFTER YOU MAKE MEDICATION CHANGE   Testing/Procedures: CALCIUM SCORE - THIS WILL COST YOU $99 OUT OF POCKET   Follow-Up: Your physician recommends that you schedule a follow-up appointment in:3 MONTHS   Any Other Special Instructions Will Be Listed Below (If Applicable).  CONTINUE TO MONITOR YOUR BLOOD PRESSURE AT HOME AND BRING READINGS TO FOLLOW UP

## 2021-04-12 NOTE — Assessment & Plan Note (Signed)
Blood pressure is better controlled but still slightly above goal.  She still has a lot of her current dose of medicine.  When she refills that she will get 100/12.5 mg.  She will check a CMP a week after that.  She is congratulated on her excellent dietary and lifestyle changes.

## 2021-04-19 ENCOUNTER — Other Ambulatory Visit: Payer: Self-pay

## 2021-04-19 ENCOUNTER — Ambulatory Visit (INDEPENDENT_AMBULATORY_CARE_PROVIDER_SITE_OTHER)
Admission: RE | Admit: 2021-04-19 | Discharge: 2021-04-19 | Disposition: A | Discharge status: Home / Self Care | Payer: Self-pay | Source: Ambulatory Visit | Attending: Cardiovascular Disease | Admitting: Cardiovascular Disease

## 2021-04-19 DIAGNOSIS — E785 Hyperlipidemia, unspecified: Secondary | ICD-10-CM

## 2021-04-19 DIAGNOSIS — I10 Essential (primary) hypertension: Secondary | ICD-10-CM

## 2021-05-09 DIAGNOSIS — E785 Hyperlipidemia, unspecified: Secondary | ICD-10-CM | POA: Diagnosis not present

## 2021-05-09 DIAGNOSIS — I10 Essential (primary) hypertension: Secondary | ICD-10-CM | POA: Diagnosis not present

## 2021-05-09 LAB — COMPREHENSIVE METABOLIC PANEL
ALT: 34 IU/L — ABNORMAL HIGH (ref 0–32)
AST: 145 IU/L — ABNORMAL HIGH (ref 0–40)
Albumin/Globulin Ratio: 1.6 (ref 1.2–2.2)
Albumin: 4.5 g/dL (ref 3.8–4.9)
Alkaline Phosphatase: 105 IU/L (ref 44–121)
BUN/Creatinine Ratio: 23 (ref 9–23)
BUN: 17 mg/dL (ref 6–24)
Bilirubin Total: 0.6 mg/dL (ref 0.0–1.2)
CO2: 25 mmol/L (ref 20–29)
Calcium: 9.2 mg/dL (ref 8.7–10.2)
Chloride: 101 mmol/L (ref 96–106)
Creatinine, Ser: 0.73 mg/dL (ref 0.57–1.00)
Globulin, Total: 2.8 g/dL (ref 1.5–4.5)
Glucose: 77 mg/dL (ref 70–99)
Potassium: 4.2 mmol/L (ref 3.5–5.2)
Sodium: 137 mmol/L (ref 134–144)
Total Protein: 7.3 g/dL (ref 6.0–8.5)
eGFR: 99 mL/min/{1.73_m2} (ref >59)

## 2021-05-09 LAB — LIPID PANEL
Chol/HDL Ratio: 3.1 ratio (ref 0.0–4.4)
Cholesterol, Total: 195 mg/dL (ref 100–199)
HDL: 63 mg/dL (ref >39)
LDL Chol Calc (NIH): 122 mg/dL — ABNORMAL HIGH (ref 0–99)
Triglycerides: 53 mg/dL (ref 0–149)
VLDL Cholesterol Cal: 10 mg/dL (ref 5–40)

## 2021-05-11 ENCOUNTER — Telehealth (HOSPITAL_BASED_OUTPATIENT_CLINIC_OR_DEPARTMENT_OTHER): Payer: Self-pay | Admitting: Cardiovascular Disease

## 2021-05-11 NOTE — Telephone Encounter (Signed)
Returned Call to patient and explained that preliminary results are in but Dr. Oval Linsey has not been able to comment on them yet! Assured the patient that she would be notified when the results are ready!

## 2021-05-11 NOTE — Telephone Encounter (Signed)
Follow Up:    Please call, questions about her lab results from yesterday(2--23)

## 2021-05-18 ENCOUNTER — Telehealth (HOSPITAL_BASED_OUTPATIENT_CLINIC_OR_DEPARTMENT_OTHER): Payer: Self-pay | Admitting: *Deleted

## 2021-05-18 DIAGNOSIS — R7989 Other specified abnormal findings of blood chemistry: Secondary | ICD-10-CM

## 2021-05-18 NOTE — Telephone Encounter (Signed)
Advised patient of lab results and mailed lab orders for recheck

## 2021-05-18 NOTE — Telephone Encounter (Signed)
-----   Message from Skeet Latch, MD sent at 05/16/2021 10:08 AM EST ----- Normal kidney function and electrolytes.  Her liver function has increased significantly.  I think he needs to stop the rosuvastatin.  Repeat LFTs in a month to make sure it has resolved.

## 2021-05-29 ENCOUNTER — Other Ambulatory Visit (HOSPITAL_COMMUNITY): Payer: Self-pay | Admitting: Cardiovascular Disease

## 2021-05-29 DIAGNOSIS — I701 Atherosclerosis of renal artery: Secondary | ICD-10-CM

## 2021-06-05 ENCOUNTER — Other Ambulatory Visit: Payer: Self-pay

## 2021-06-05 ENCOUNTER — Ambulatory Visit (HOSPITAL_COMMUNITY)
Admission: RE | Admit: 2021-06-05 | Discharge: 2021-06-05 | Disposition: A | Discharge status: Home / Self Care | Payer: BC Managed Care – PPO | Source: Ambulatory Visit | Attending: Cardiology | Admitting: Cardiology

## 2021-06-05 DIAGNOSIS — I701 Atherosclerosis of renal artery: Secondary | ICD-10-CM | POA: Insufficient documentation

## 2021-06-12 ENCOUNTER — Encounter (HOSPITAL_BASED_OUTPATIENT_CLINIC_OR_DEPARTMENT_OTHER): Payer: Self-pay | Admitting: Cardiovascular Disease

## 2021-06-12 NOTE — Telephone Encounter (Signed)
Patient sent in message with questions regarding recent renal scans, please advise on how to proceed!  ?

## 2021-06-15 ENCOUNTER — Other Ambulatory Visit: Payer: Self-pay | Admitting: *Deleted

## 2021-06-15 DIAGNOSIS — R7989 Other specified abnormal findings of blood chemistry: Secondary | ICD-10-CM | POA: Diagnosis not present

## 2021-06-15 DIAGNOSIS — I701 Atherosclerosis of renal artery: Secondary | ICD-10-CM

## 2021-06-16 LAB — HEPATIC FUNCTION PANEL
ALT: 16 IU/L (ref 0–32)
AST: 24 IU/L (ref 0–40)
Albumin: 4.5 g/dL (ref 3.8–4.9)
Alkaline Phosphatase: 116 IU/L (ref 44–121)
Bilirubin Total: 0.4 mg/dL (ref 0.0–1.2)
Bilirubin, Direct: 0.1 mg/dL (ref 0.00–0.40)
Total Protein: 7.2 g/dL (ref 6.0–8.5)

## 2021-06-19 NOTE — Telephone Encounter (Signed)
Patient viewed results/Dr [place]'s comments in mychart  ?

## 2021-07-13 ENCOUNTER — Ambulatory Visit (HOSPITAL_BASED_OUTPATIENT_CLINIC_OR_DEPARTMENT_OTHER): Payer: BC Managed Care – PPO | Admitting: Cardiovascular Disease

## 2021-07-19 ENCOUNTER — Telehealth (HOSPITAL_BASED_OUTPATIENT_CLINIC_OR_DEPARTMENT_OTHER): Payer: Self-pay

## 2021-07-19 NOTE — Telephone Encounter (Addendum)
Results called to patient who verbalizes understanding!  ? ? ?----- Message from Skeet Latch, MD sent at 07/18/2021 12:09 AM EDT ----- ?Liver function has normalized.  ?

## 2021-07-23 ENCOUNTER — Ambulatory Visit (HOSPITAL_BASED_OUTPATIENT_CLINIC_OR_DEPARTMENT_OTHER): Payer: BC Managed Care – PPO | Admitting: Cardiovascular Disease

## 2021-09-14 ENCOUNTER — Ambulatory Visit (INDEPENDENT_AMBULATORY_CARE_PROVIDER_SITE_OTHER): Payer: BC Managed Care – PPO | Admitting: Cardiovascular Disease

## 2021-09-14 ENCOUNTER — Encounter (HOSPITAL_BASED_OUTPATIENT_CLINIC_OR_DEPARTMENT_OTHER): Payer: Self-pay | Admitting: Cardiovascular Disease

## 2021-09-14 VITALS — BP 116/72 | HR 49 | Ht 64.0 in | Wt 178.0 lb

## 2021-09-14 DIAGNOSIS — R001 Bradycardia, unspecified: Secondary | ICD-10-CM

## 2021-09-14 DIAGNOSIS — I701 Atherosclerosis of renal artery: Secondary | ICD-10-CM

## 2021-09-14 HISTORY — DX: Bradycardia, unspecified: R00.1

## 2021-09-14 NOTE — Patient Instructions (Signed)
Medication Instructions:  Your physician recommends that you continue on your current medications as directed. Please refer to the Current Medication list given to you today.  *If you need a refill on your cardiac medications before your next appointment, please call your pharmacy*  Lab Work: TSH TODAY  If you have labs (blood work) drawn today and your tests are completely normal, you will receive your results only by: MyChart Message (if you have MyChart) OR A paper copy in the mail If you have any lab test that is abnormal or we need to change your treatment, we will call you to review the results.  Testing/Procedures: NONE  Follow-Up: At [facility], you and your health needs are our priority.  As part of our continuing mission to provide you with exceptional heart care, we have created designated Provider Care Teams.  These Care Teams include your primary Cardiologist (physician) and Advanced Practice Providers (APPs -  Physician Assistants and Nurse Practitioners) who all work together to provide you with the care you need, when you need it.  We recommend signing up for the patient portal called "MyChart".  Sign up information is provided on this After Visit Summary.  MyChart is used to connect with patients for Virtual Visits (Telemedicine).  Patients are able to view lab/test results, encounter notes, upcoming appointments, etc.  Non-urgent messages can be sent to your provider as well.   To learn more about what you can do with MyChart, go to [email or web address].    Your next appointment:   12 month(s)  The format for your next appointment:   In Person  Provider:   Tiffany [place], MD        

## 2021-09-14 NOTE — Assessment & Plan Note (Signed)
Mild renal artery stenosis on Doppler.  This has been stable.  Repeat imaging in 2 years.

## 2021-09-14 NOTE — Progress Notes (Signed)
Advanced Hypertension Clinic Follow-up:    Date:  09/14/2021   ID:  Krystal Bauer, DOB 11-10-1970, MRN [id number]  PCP:  Seward Carol, MD  Cardiologist:  None  Nephrologist:  Referring MD: Seward Carol, MD   CC: Hypertension  History of Present Illness:    Krystal Bauer is a 51 y.o. female with a hx of hypertension, hyperlipidemia, PCOS and fibroids here follow-up. She initially established care in the advanced hypertension clinic 04/17/2020. April 2021 she had a uterine fibroid procedure.  Following the procedure she struggled with headaches.  She checked her BP and it was 190s/100s.  She went to the ED and it was 200/118.  She was started on HCTZ and losartan was subsequently added. She was referred to [facility]. Renal artery dopplers 04/2020 revealed mild blockage on the right and none on the left. Renin and aldosterone were within normal limits. Thyroid function was normal. Losartan/HCTZ was increased, however she stopped taking it due to a rash. When she saw our pharmacist, she was not taking any medicines and was started on amlodipine. She was also started on rosuvastatin for her lipids.  At her last appointment she was doing well and following a pescatarian diet.  Her blood pressures were well controlled.  She had a coronary calcium score 04/2021 with a score of 0.  Repeat renal artery Dopplers 05/2021 showed mild blockage on the right and none on the left.  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  She is enjoying it but it is challenging for her to keep up with her exercise schedule.  She likes to do water aerobics.  She has no exertional chest pain or shortness of breath.  She has not had any lower extremity edema, orthopnea, or PND.  At home her blood pressures have been very well controlled.  She has not had any lightheadedness or dizziness but does note that she sometimes feels tired and sleepy all the time.  She only  snores when she is very tired.  Her husband has not noted any labored breathing or signs of sleep apnea.    Past Medical History:  Diagnosis Date   Bradycardia 6/0/6301   Complication of anesthesia    pt feels she has prolonged effects of anes, lethargy and feels 'woozey"   Essential hypertension 04/17/2020   History of PCOS    Medical history non-contributory    Ovarian cyst     Past Surgical History:  Procedure Laterality Date   DILATATION & CURRETTAGE/HYSTEROSCOPY WITH RESECTOCOPE N/A 12/03/2012   Procedure: DILATATION & CURETTAGE/HYSTEROSCOPY WITH RESECTION OF ENDOMETRIAL POLYP and fibroid;  Surgeon: Ena Dawley, MD;  Location: [facility];  Service: Gynecology;  Laterality: N/A;   DILATION AND CURETTAGE OF UTERUS     x3   IR ANGIOGRAM PELVIS SELECTIVE OR SUPRASELECTIVE  07/12/2019   IR ANGIOGRAM PELVIS SELECTIVE OR SUPRASELECTIVE  07/12/2019   IR ANGIOGRAM SELECTIVE EACH ADDITIONAL VESSEL  07/12/2019   IR ANGIOGRAM SELECTIVE EACH ADDITIONAL VESSEL  07/12/2019   IR EMBO TUMOR ORGAN ISCHEMIA INFARCT INC GUIDE ROADMAPPING  07/12/2019   IR RADIOLOGIST EVAL & MGMT  04/22/2019   IR RADIOLOGIST EVAL & MGMT  08/10/2019   IR RADIOLOGIST EVAL & MGMT  02/16/2020   IR US GUIDE VASC ACCESS RIGHT  07/12/2019   OVARIAN CYST SURGERY     WISDOM TOOTH EXTRACTION      Current Medications: Current Meds  Medication Sig   valsartan-hydrochlorothiazide (DIOVAN HCT)  160-25 MG tablet Take 1 tablet by mouth daily.     Allergies:   Hyzaar [losartan potassium-hctz], Wheat bran, Crestor [rosuvastatin], Latex, Sulfa antibiotics, and Sulfamethoxazole-trimethoprim   Social History   Socioeconomic History   Marital status: Single    Spouse name: Not on file   Number of children: Not on file   Years of education: Not on file   Highest education level: Not on file  Occupational History   Not on file  Tobacco Use   Smoking status: Never   Smokeless tobacco: Never  Vaping Use   Vaping Use: Never used   Substance and Sexual Activity   Alcohol use: Yes    Comment: socially    Drug use: No   Sexual activity: Not Currently    Birth control/protection: Abstinence  Other Topics Concern   Not on file  Social History Narrative   Not on file   Social Determinants of Health   Financial Resource Strain: Low Risk  (04/12/2021)   Overall Financial Resource Strain (CARDIA)    Difficulty of Paying Living Expenses: Not hard at all  Food Insecurity: No Food Insecurity (04/12/2021)   Hunger Vital Sign    Worried About Running Out of Food in the Last Year: Never true    [place] in the Last Year: Never true  Transportation Needs: No Transportation Needs (04/12/2021)   PRAPARE - Hydrologist (Medical): No    Lack of Transportation (Non-Medical): No  Physical Activity: Sufficiently Active (04/12/2021)   Exercise Vital Sign    Days of Exercise per Week: 3 days    Minutes of Exercise per Session: 60 min  Stress: No Stress Concern Present (07/10/2020)   [place]    Feeling of Stress : Not at all  Recent Concern: Stress - Stress Concern Present (06/14/2020)   [place]    Feeling of Stress : To some extent  Social Connections: Not on file     Family History: The patient's family history includes Asthma in her mother; Diabetes in her father and mother; Heart Problems in her father; Hypertension in her father and mother; Parkinson's disease in her mother; Prostate cancer in her brother.  ROS:   Please see the history of present illness.    (+) Stress (+) Skin rash All other systems reviewed and are negative.  EKGs/Labs/Other Studies Reviewed:    Bilateral Renal Artery Duplex 05/08/2020: Summary:  Largest Aortic Diameter: 2.1 cm     Renal:     Right: Normal size right kidney. Normal right Resistive Index.         Normal cortical  thickness of right kidney. 1-59% stenosis of         the right renal artery. RRV flow present.  Left:  Normal size of left kidney. Normal left Resistive Index.         Normal cortical thickness of the left kidney. No evidence of         left renal artery stenosis. LRV flow present.  Mesenteric:  Normal Celiac artery and Superior Mesenteric artery findings.     Patent IVC.   EKG:   09/14/2021: Sinus bradycardia.  Rate 49 bpm. 04/17/2020: sinus rhythm.  Rate 71 bpm.   Recent Labs: 05/09/2021: BUN 17; Creatinine, Ser 0.73; Potassium 4.2; Sodium 137 06/15/2021: ALT 16   Recent Lipid Panel    Component Value Date/Time  CHOL 195 05/09/2021 1143   TRIG 53 05/09/2021 1143   HDL 63 05/09/2021 1143   CHOLHDL 3.1 05/09/2021 1143   CHOLHDL 3.5 02/24/2012 1027   VLDL 13 02/24/2012 1027   LDLCALC 122 (H) 05/09/2021 1143    Physical Exam:    VS:  BP 116/72 (BP Location: Right Arm, Patient Position: Sitting, Cuff Size: Normal)   Pulse (!) 49   Ht '5\' 4"'$  (1.626 m)   Wt 178 lb (80.7 kg)   BMI 30.55 kg/m  , BMI Body mass index is 30.55 kg/m. GENERAL:  Well appearing HEENT: Pupils equal round and reactive, fundi not visualized, oral mucosa unremarkable NECK:  No jugular venous distention, waveform within normal limits, carotid upstroke brisk and symmetric, no bruits LUNGS:  Clear to auscultation bilaterally HEART:  RRR.  PMI not displaced or sustained,S1 and S2 within normal limits, no S3, no S4, no clicks, no rubs, no murmurs ABD:  Flat, positive bowel sounds normal in frequency in pitch, no bruits, no rebound, no guarding, no midline pulsatile mass, no hepatomegaly, no splenomegaly EXT:  2 plus pulses throughout, no edema, no cyanosis no clubbing SKIN:  No rashes no nodules NEURO:  Cranial nerves II through XII grossly intact, motor grossly intact throughout PSYCH:  Cognitively intact, oriented to person place and time   ASSESSMENT:    1. Bradycardia   2. Renal artery stenosis [facility])        PLAN:    Essential hypertension She is doing a good job with diet and exercise.  Continue valsartan/HCTZ.  Hyperlipidemia Lipids are improving with diet and exercise.  She had a calcium score of 0.  No statin at this time.  Bradycardia Heart rate was 49 today.  Given that she also has some fatigue we will check a TSH.  She is otherwise asymptomatic.  No signs of sleep apnea.  Avoid nodal agents.  Renal artery stenosis (HCC) Mild renal artery stenosis on Doppler.  This has been stable.  Repeat imaging in 2 years.    Disposition:    FU with [place] Compean C. Oval Linsey, MD, [facility] in 3 months   Medication Adjustments/Labs and Tests Ordered: Current medicines are reviewed at length with the patient today.  Concerns regarding medicines are outlined above.   Orders Placed This Encounter  Procedures   TSH   EKG 12-Lead   No orders of the defined types were placed in this encounter.  Signed, Skeet Latch, MD  09/14/2021 10:58 AM    [place]

## 2021-09-14 NOTE — Assessment & Plan Note (Signed)
Heart rate was 49 today.  Given that she also has some fatigue we will check a TSH.  She is otherwise asymptomatic.  No signs of sleep apnea.  Avoid nodal agents.

## 2021-09-14 NOTE — Assessment & Plan Note (Signed)
She is doing a good job with diet and exercise.  Continue valsartan/HCTZ.

## 2021-09-14 NOTE — Assessment & Plan Note (Signed)
Lipids are improving with diet and exercise.  She had a calcium score of 0.  No statin at this time.

## 2021-09-15 LAB — TSH: TSH: 2.23 u[IU]/mL (ref 0.450–4.500)

## 2021-09-17 ENCOUNTER — Telehealth: Payer: Self-pay | Admitting: Cardiovascular Disease

## 2021-09-17 MED ORDER — VALSARTAN-HYDROCHLOROTHIAZIDE 160-25 MG PO TABS: 0.5000 | ORAL_TABLET | Freq: Every day | ORAL | 1 refills | Status: DC

## 2021-09-17 NOTE — Telephone Encounter (Signed)
RN returned call to patient, no answer, left message and provided the following recommendations and updated medication list.     "Please have her cut the valsartan/HCTZ in half.  Keep tracking BP.   ~Tiffany"

## 2021-09-17 NOTE — Telephone Encounter (Signed)
Pt c/o BP issue: STAT if pt c/o blurred vision, one-sided weakness or slurred speech  1. What are your last 5 BP readings?  108/80 - This morning 110/72 - Yesterday 96/68 - Yesterday 100/70 - Yesterday  2. Are you having any other symptoms (ex. Dizziness, headache, blurred vision, passed out)? Weak and tired  3. What is your BP issue? Pt states that she was told to call if BP ran low. Please advise

## 2021-09-17 NOTE — Telephone Encounter (Signed)
Please advise 

## 2021-11-08 ENCOUNTER — Ambulatory Visit (HOSPITAL_BASED_OUTPATIENT_CLINIC_OR_DEPARTMENT_OTHER): Payer: BC Managed Care – PPO | Admitting: Cardiovascular Disease

## 2021-12-25 ENCOUNTER — Encounter (HOSPITAL_BASED_OUTPATIENT_CLINIC_OR_DEPARTMENT_OTHER): Payer: Self-pay | Admitting: Cardiovascular Disease

## 2022-03-26 ENCOUNTER — Other Ambulatory Visit (HOSPITAL_BASED_OUTPATIENT_CLINIC_OR_DEPARTMENT_OTHER): Payer: Self-pay

## 2022-03-26 MED ORDER — FLUARIX QUADRIVALENT 0.5 ML IM SUSY
PREFILLED_SYRINGE | INTRAMUSCULAR | 0 refills | Status: DC
Start: 2022-03-26 — End: 2023-09-30
  Filled 2022-03-26: qty 0.5, 1d supply, fill #0

## 2022-03-26 MED ORDER — COMIRNATY 30 MCG/0.3ML IM SUSY
PREFILLED_SYRINGE | INTRAMUSCULAR | 0 refills | Status: DC
  Filled 2022-03-26: qty 0.3, 1d supply, fill #0

## 2022-05-28 ENCOUNTER — Other Ambulatory Visit: Payer: Self-pay | Admitting: Internal Medicine

## 2022-05-28 ENCOUNTER — Ambulatory Visit
Admission: RE | Admit: 2022-05-28 | Discharge: 2022-05-28 | Disposition: A | Discharge status: Home / Self Care | Payer: 59 | Source: Ambulatory Visit | Attending: Internal Medicine | Admitting: Internal Medicine

## 2022-05-28 DIAGNOSIS — M545 Low back pain, unspecified: Secondary | ICD-10-CM

## 2022-06-12 ENCOUNTER — Telehealth (HOSPITAL_BASED_OUTPATIENT_CLINIC_OR_DEPARTMENT_OTHER): Payer: Self-pay | Admitting: Cardiovascular Disease

## 2022-06-12 NOTE — Telephone Encounter (Signed)
Left message for patient to call and discuss scheduling the follow up renal artery duplex ordered by Dr. Oval Linsey

## 2022-06-17 NOTE — Telephone Encounter (Signed)
Left message for patient to call and discuss scheduling the follow up renal artery doppler ordered by Dr. Oval Linsey

## 2022-06-18 ENCOUNTER — Other Ambulatory Visit (HOSPITAL_BASED_OUTPATIENT_CLINIC_OR_DEPARTMENT_OTHER): Payer: Self-pay | Admitting: *Deleted

## 2022-06-18 DIAGNOSIS — I10 Essential (primary) hypertension: Secondary | ICD-10-CM

## 2022-06-18 DIAGNOSIS — I701 Atherosclerosis of renal artery: Secondary | ICD-10-CM

## 2022-07-16 ENCOUNTER — Encounter (HOSPITAL_BASED_OUTPATIENT_CLINIC_OR_DEPARTMENT_OTHER): Payer: 59

## 2022-07-17 ENCOUNTER — Telehealth (HOSPITAL_BASED_OUTPATIENT_CLINIC_OR_DEPARTMENT_OTHER): Payer: Self-pay | Admitting: Cardiovascular Disease

## 2022-07-17 NOTE — Telephone Encounter (Signed)
Patient states she is not ready to reschedule the Renal artery duplex ordered by Dr. Duke Salvia.  Will call back at a later date

## 2022-08-21 NOTE — Therapy (Signed)
OUTPATIENT PHYSICAL THERAPY THORACOLUMBAR EVALUATION   Patient Name: Krystal Bauer MRN: [id number] DOB:Aug 08, 1970,52 y.o., female Today's Date: 08/23/2022   END OF SESSION:  PT End of Session - 08/22/22 1025     Visit Number 1    Number of Visits 17    Date for PT Re-Evaluation 10/19/22    Authorization Type [employer]    PT Start Time 1025   patient late   PT Stop Time 1100    PT Time Calculation (min) 35 min    Activity Tolerance Patient tolerated treatment well    Behavior During Therapy [facility] for tasks assessed/performed              Past Medical History:  Diagnosis Date   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   Complication of anesthesia    pt feels she has prolonged effects of anes, lethargy and feels 'woozey"   Essential hypertension 04/17/2020   History of PCOS    Medical history non-contributory    Ovarian cyst    Past Surgical History:  Procedure Laterality Date   DILATATION & CURRETTAGE/HYSTEROSCOPY WITH RESECTOCOPE N/A 12/03/2012   Procedure: DILATATION & CURETTAGE/HYSTEROSCOPY WITH RESECTION OF ENDOMETRIAL POLYP and fibroid;  Surgeon: Kirkland Hun, MD;  Location: [facility];  Service: Gynecology;  Laterality: N/A;   DILATION AND CURETTAGE OF UTERUS     x3   IR ANGIOGRAM PELVIS SELECTIVE OR SUPRASELECTIVE  07/12/2019   IR ANGIOGRAM PELVIS SELECTIVE OR SUPRASELECTIVE  07/12/2019   IR ANGIOGRAM SELECTIVE EACH ADDITIONAL VESSEL  07/12/2019   IR ANGIOGRAM SELECTIVE EACH ADDITIONAL VESSEL  07/12/2019   IR EMBO TUMOR ORGAN ISCHEMIA INFARCT INC GUIDE ROADMAPPING  07/12/2019   IR RADIOLOGIST EVAL & MGMT  04/22/2019   IR RADIOLOGIST EVAL & MGMT  08/10/2019   IR RADIOLOGIST EVAL & MGMT  02/16/2020   IR US GUIDE VASC ACCESS RIGHT  07/12/2019   OVARIAN CYST SURGERY     WISDOM TOOTH EXTRACTION     Patient Active Problem List   Diagnosis Date Noted   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   Renal artery stenosis (HCC) 09/14/2021   Hyperlipidemia 05/31/2020   Essential hypertension 04/17/2020   Uterine  fibroid 07/12/2019   Obesity 05/02/2014   Rash and nonspecific skin eruption 05/02/2014   Bilateral polycystic ovarian syndrome 02/01/2008    PCP: Renford Dills, MD   REFERRING PROVIDER: Christena Deem, MD   REFERRING DIAG: M54.50 (ICD-10-CM) - Low back pain, unspecified   Rationale for Evaluation and Treatment: Rehabilitation  THERAPY DIAG:  Other low back pain  Pain in right hip  Muscle weakness (generalized)  ONSET DATE: chronic   SUBJECTIVE:  SUBJECTIVE STATEMENT: Patient reports she travels a lot for work and would notice some back pain after traveling, but more recently it has worsened. She reports the pain in her back has been intermittent for a couple years, but the Rt hip pain began about 3 months ago of insidious onset. 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 She was offered an injection in her hip, but declined. She is potentially interested in an epidural injection, but not right now. She denies any numbness or tingling. No changes in bowel/bladder. No pain in the back currently, but when she experiences this it is in her low back described as an ache.   PERTINENT HISTORY:  Bradycardia Hypertension   PAIN:  Are you having pain? Yes: NPRS scale: 1 currently (at worst 6)/10 Pain location: Rt anterior hip Pain description: twinge Aggravating factors: walking, transitioning from sitting to standing, prolonged sitting Relieving factors: husband will pull her up (traction)  PRECAUTIONS: None  WEIGHT BEARING RESTRICTIONS: No  FALLS:  Has patient fallen in last 6 months? No  LIVING ENVIRONMENT: Lives with: lives with their spouse Lives in: House/apartment Stairs: Yes: Internal: 15 steps; on left going up Has following equipment at  home: None  OCCUPATION: owner of a medical affairs company   PLOF: Independent  PATIENT GOALS: "I'm hoping this will help some and maneuver differently. I still have to work and travel."   OBJECTIVE:   DIAGNOSTIC FINDINGS:  Lumbar X-ray: IMPRESSION: 1. Moderate lower lumbar facet hypertrophy. 2. Mild multilevel spondylosis with endplate spurring. 3. Incidental note of normal variant anatomy. 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  PATIENT SURVEYS:  FOTO 50% function to 66% predicted  SCREENING FOR RED FLAGS: Bowel or bladder incontinence: No Spinal tumors: No Cauda equina syndrome: No Compression fracture: No Abdominal aneurysm: No  COGNITION: Overall cognitive status: Within functional limits for tasks assessed     SENSATION: Not tested  MUSCLE LENGTH: Hamstrings: WNL bilaterally  Piriformis: moderate tightness bilaterally   POSTURE: increased lumbar lordosis  PALPATION: TTP bilateral lumbar paraspinals   LUMBAR ROM:   AROM eval  Flexion Full pn on ascent   Extension 25% limited LBP  Right lateral flexion 25% limited  Left lateral flexion 25% limited   Right rotation 50% limited  Left rotation 50% limited    (Blank rows = not tested)  LOWER EXTREMITY ROM:     Active  Right eval Left eval  Hip flexion 85 pn 100  Hip extension    Hip abduction    Hip adduction    Hip internal rotation 30 35  Hip external rotation 25 25  Knee flexion    Knee extension    Ankle dorsiflexion    Ankle plantarflexion    Ankle inversion    Ankle eversion     (Blank rows = not tested)  LOWER EXTREMITY MMT:    MMT Right eval Left eval  Hip flexion 4- 4  Hip extension 4- 4-  Hip abduction 4- 4-  Hip adduction    Hip internal rotation 4+ 4+  Hip external rotation 4+ 4+  Knee flexion    Knee extension    Ankle dorsiflexion    Ankle plantarflexion    Ankle inversion    Ankle eversion     (Blank rows = not tested)  LUMBAR SPECIAL  TESTS:  SLR (-) FABER (+) FADIR (+)  Thomas (+) Hip flexor   FUNCTIONAL TESTS:  Not  tested   GAIT: Distance walked: 10 ft  Assistive device utilized: None Level of assistance: Complete Independence Comments: trendelenberg   [facility] Adult PT Treatment:                                                DATE: 08/22/22  Therapeutic Exercise: Demonstrated and issued initial HEP.    Therapeutic Activity: Education on assessment findings that will be addressed throughout duration of POC.      PATIENT EDUCATION:  Education details: see treatment Person educated: Patient Education method: Explanation, Demonstration, Tactile cues, Verbal cues, and Handouts Education comprehension: verbalized understanding, returned demonstration, verbal cues required, tactile cues required, and needs further education  HOME EXERCISE PROGRAM: Access Code: [email or web address] URL: [email or web address] Date: 08/22/2022 Prepared by: Letitia Libra  Exercises - Supine Lower Trunk Rotation  - 1 x daily - 7 x weekly - 1 sets - 10 reps - 5 sec  hold - Modified Thomas Stretch  - 1 x daily - 7 x weekly - 1 sets - 1 min hold  ASSESSMENT:  CLINICAL IMPRESSION: Patient is a 52 y.o. female who was seen today for physical therapy evaluation and treatment for chronic low back and Rt anterior hip pain of insidious onset. Upon assessment she is noted to have limitations in all planes of lumbar AROM with exception of flexion, limited and painful Rt hip flexion AROM, hip flexor and piriformis tightness, and bilateral hip weakness. Patient will benefit from skilled PT to address the above stated deficits in order to optimize their function and assist in overall pain reduction.    OBJECTIVE IMPAIRMENTS: Abnormal gait, decreased activity tolerance, decreased endurance, decreased knowledge of condition, difficulty walking, decreased ROM, decreased strength, increased fascial restrictions, impaired flexibility, improper  body mechanics, postural dysfunction, and pain.   ACTIVITY LIMITATIONS: bending, sitting, standing, squatting, and locomotion level  PARTICIPATION LIMITATIONS: driving, community activity, and occupation  PERSONAL FACTORS: Age, Fitness, Profession, Time since onset of injury/illness/exacerbation, and 1 comorbidity: see PMH above  are also affecting patient's functional outcome.   REHAB POTENTIAL: Good  CLINICAL DECISION MAKING: Evolving/moderate complexity  EVALUATION COMPLEXITY: Moderate   GOALS: Goals reviewed with patient? Yes  SHORT TERM GOALS: Target date: 09/19/2022   Patient will be independent and compliant with initial HEP.   Baseline: see above Goal status: INITIAL  2.  Patient will demonstrate at least 95 degrees of Rt hip flexion AROM to improve sitting tolerance.  Baseline: see above  Goal status: INITIAL  3.  Patient will demonstrate pain free lumbar flexion AROM to improve ability to complete bending/lifting activity.  Baseline: see above  Goal status: INITIAL  LONG TERM GOALS: Target date: 10/19/22  Patient will score at least 66% on FOTO to signify clinically meaningful improvement in functional abilities.   Baseline: see above Goal status: INITIAL  2.  Patient will demonstrate 4+/5 bilateral hip abductor/extensor strength to improve stability about the chain with prolonged walking/standing activity.  Baseline: see above Goal status: INITIAL  3.  Patient will report pain at worst rated as </=4/10 to reduce her current functional limitations.  Baseline: see above Goal status: INITIAL  4.  Patient will be independent with advanced home program to assist in management of her chronic condition.  Baseline: see above Goal status: INITIAL   PLAN:  PT FREQUENCY: 1-2x/week  PT DURATION: 8 weeks  PLANNED INTERVENTIONS:  Therapeutic exercises, Therapeutic activity, Neuromuscular re-education, Balance training, Gait training, Patient/Family education,  Self Care, Joint mobilization, Aquatic Therapy, Dry Needling, Cryotherapy, Moist heat, Traction, Manual therapy, and Re-evaluation.  PLAN FOR NEXT SESSION: review and progress HEP prn; manual to Rt hip, hip strengthening, core strengthening   Letitia Libra, PT, DPT, ATC 08/23/22 8:32 AM

## 2022-08-22 ENCOUNTER — Ambulatory Visit: Payer: 59 | Attending: Sports Medicine

## 2022-08-22 ENCOUNTER — Other Ambulatory Visit: Payer: Self-pay

## 2022-08-22 DIAGNOSIS — M25551 Pain in right hip: Secondary | ICD-10-CM | POA: Diagnosis present

## 2022-08-22 DIAGNOSIS — M5459 Other low back pain: Secondary | ICD-10-CM | POA: Diagnosis present

## 2022-08-22 DIAGNOSIS — M6281 Muscle weakness (generalized): Secondary | ICD-10-CM | POA: Insufficient documentation

## 2022-08-22 NOTE — Patient Instructions (Signed)
Aquatic Therapy at [facility]-  What to Expect!  Where:   [facility] [address] [place], [place] [address] Rehab phone [phone]  NOTE:  You will receive an automated phone message reminding you of your appt and it will say the appointment is at the [address] [facility] [address] [facility] clinic.          How to Prepare: Please make sure you drink 8 ounces of water about one hour prior to your pool session A caregiver may attend if needed with the patient to help assist as needed. A caregiver can sit in the pool room on chair. Please arrive IN YOUR SUIT and 15 minutes prior to your appointment - this helps to avoid delays in starting your session. Please make sure to attend to any toileting needs prior to entering the pool Locker rooms for changing are provided.   There is direct access to the pool deck form the locker room.  You can lock your belongings in a locker with lock provided. Once on the pool deck your therapist will ask if you have signed the Patient  Consent and Assignment of Benefits form before beginning treatment Your therapist may take your blood pressure prior to, during and after your session if indicated We usually try and create a home exercise program based on activities we do in the pool.  Please be thinking about who might be able to assist you in the pool should you need to participate in an aquatic home exercise program at the time of discharge if you need assistance.  Some patients do not want to or do not have the ability to participate in an aquatic home program - this is not a barrier in any way to you participating in aquatic therapy as part of your current therapy plan! After Discharge from PT, you can continue using home program at  the [facility]/, there is a drop-in fee for $5 ($45 a month)or for 60 years  or older $4.00 ($40 a month for seniors ) or any local [employer] pool.  Memberships for purchase are  available for gym/pool at [facility]  IT IS VERY IMPORTANT THAT YOUR LAST VISIT BE IN THE CLINIC AT [address] AFTER YOUR LAST AQUATIC VISIT.  PLEASE MAKE SURE THAT YOU HAVE A [address]  APPOINTMENT SCHEDULED.   About the pool: Pool is located approximately 500 FT from the entrance of the building.  Please bring a support person if you need assistance traveling this      distance.   Your therapist will assist you in entering the water; there are two ways to           enter: stairs with railings, and a mechanical lift. Your therapist will determine the most appropriate way for you.  Water temperature is usually between 88-90 degrees  There may be up to 2 other swimmers in the pool at the same time  The pool deck is tile, please wear shoes with good traction if you prefer not to be barefoot.    Contact Info:  For appointment scheduling and cancellations:         Please call the [facility]  PH:[phone]              Aquatic Therapy  [facility]patient [facility]       All sessions are 45 minutes                                                    

## 2022-08-26 ENCOUNTER — Ambulatory Visit: Payer: 59

## 2022-08-26 DIAGNOSIS — M5459 Other low back pain: Secondary | ICD-10-CM | POA: Diagnosis not present

## 2022-08-26 DIAGNOSIS — M6281 Muscle weakness (generalized): Secondary | ICD-10-CM

## 2022-08-26 DIAGNOSIS — M25551 Pain in right hip: Secondary | ICD-10-CM

## 2022-08-26 NOTE — Therapy (Signed)
OUTPATIENT PHYSICAL THERAPY TREATMENT NOTE   Patient Name: Krystal Bauer MRN: [id number] DOB:10-06-1970, 52 y.o., female Today's Date: 08/26/2022  PCP: Renford Dills, MD  REFERRING PROVIDER: Christena Deem, MD   END OF SESSION:   PT End of Session - 08/26/22 1017     Visit Number 2    Number of Visits 17    Date for PT Re-Evaluation 10/19/22    Authorization Type [employer]et[employer]    PT Start Time 1017    PT Stop Time 1059    PT Time Calculation (min) 42 min    Activity Tolerance Patient tolerated treatment well    Behavior During Therapy [facility] for tasks assessed/performed             Past Medical History:  Diagnosis Date   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   Complication of anesthesia    pt feels she has prolonged effects of anes, lethargy and feels 'woozey"   Essential hypertension 04/17/2020   History of PCOS    Medical history non-contributory    Ovarian cyst    Past Surgical History:  Procedure Laterality Date   DILATATION & CURRETTAGE/HYSTEROSCOPY WITH RESECTOCOPE N/A 12/03/2012   Procedure: DILATATION & CURETTAGE/HYSTEROSCOPY WITH RESECTION OF ENDOMETRIAL POLYP and fibroid;  Surgeon: Kirkland Hun, MD;  Location: [facility];  Service: Gynecology;  Laterality: N/A;   DILATION AND CURETTAGE OF UTERUS     x3   IR ANGIOGRAM PELVIS SELECTIVE OR SUPRASELECTIVE  07/12/2019   IR ANGIOGRAM PELVIS SELECTIVE OR SUPRASELECTIVE  07/12/2019   IR ANGIOGRAM SELECTIVE EACH ADDITIONAL VESSEL  07/12/2019   IR ANGIOGRAM SELECTIVE EACH ADDITIONAL VESSEL  07/12/2019   IR EMBO TUMOR ORGAN ISCHEMIA INFARCT INC GUIDE ROADMAPPING  07/12/2019   IR RADIOLOGIST EVAL & MGMT  04/22/2019   IR RADIOLOGIST EVAL & MGMT  08/10/2019   IR RADIOLOGIST EVAL & MGMT  02/16/2020   IR US GUIDE VASC ACCESS RIGHT  07/12/2019   OVARIAN CYST SURGERY     WISDOM TOOTH EXTRACTION     Patient Active Problem List   Diagnosis Date Noted   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 I did the exercises, but the one off the side of the bed is not very pleasant." She reports on Saturday afternoon noticing small amount of stool when she went to wipe at a later toileting time and is unsure of the cause. No other episodes of incontinence and denies any saddle paresthesia.    PAIN:  Are you having pain? None currently; NPRS scale: 6 at worst/10 Pain location: Rt anterior hip Pain description: twinge Aggravating factors: walking, transitioning from sitting to standing, prolonged sitting Relieving factors: husband will pull her up (traction)   OBJECTIVE: (objective measures completed at initial evaluation unless otherwise dated)  DIAGNOSTIC FINDINGS:  Lumbar X-ray: IMPRESSION: 1. Moderate lower lumbar facet hypertrophy. 2. Mild multilevel spondylosis with endplate spurring. 3. Incidental note of normal variant anatomy. 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   PATIENT SURVEYS:  FOTO 50% function to 66% predicted   SCREENING FOR RED FLAGS: Bowel or bladder incontinence: No Spinal tumors: No Cauda equina syndrome: No Compression fracture: No Abdominal aneurysm: No   COGNITION: Overall cognitive status: Within functional limits for tasks assessed                           SENSATION: Not tested   MUSCLE LENGTH: Hamstrings: WNL bilaterally  Piriformis: moderate tightness bilaterally    POSTURE: increased lumbar lordosis   PALPATION: TTP bilateral lumbar paraspinals    LUMBAR ROM:    AROM eval  Flexion Full pn on ascent   Extension 25% limited LBP  Right lateral flexion 25% limited  Left lateral flexion 25% limited   Right rotation 50% limited  Left rotation 50% limited    (Blank rows = not tested)   LOWER EXTREMITY ROM:      Active  Right eval Left eval 08/26/22 Right   Hip flexion 85 pn 100 100 pn   Hip extension       Hip abduction       Hip adduction       Hip internal rotation 30 35   Hip external rotation 25 25   Knee flexion       Knee extension       Ankle dorsiflexion       Ankle plantarflexion       Ankle inversion       Ankle eversion        (Blank rows = not tested)   LOWER EXTREMITY MMT:     MMT Right eval Left eval  Hip flexion 4- 4  Hip extension 4- 4-  Hip abduction 4- 4-  Hip adduction      Hip internal rotation 4+ 4+  Hip external rotation 4+ 4+  Knee flexion      Knee extension      Ankle dorsiflexion      Ankle plantarflexion      Ankle inversion      Ankle eversion       (Blank rows = not tested)   LUMBAR SPECIAL TESTS:  SLR (-) FABER (+) FADIR (+)  Thomas (+) Hip flexor    FUNCTIONAL TESTS:  Not tested    GAIT: Distance walked: 10 ft  Assistive device utilized: None Level of assistance: Complete Independence Comments: trendelenberg  [facility] Adult PT Treatment:  DATE: 08/26/22 Therapeutic Exercise: LTR x 1 minute  Supine hip flexor stretch x 60 sec  Hip bridge 2 x 10  Hooklying hip abduction blue band 2 x 10  Supine posterior pelvic tilts 2 x 10; 5 sec hold  Updated HEP Manual Therapy: RLE LAD 3 x 1 minute Rt hip posterior and inferior joint mobilizations grade II-III Demo and returned demo of use of tennis ball for  self-soft tissue mobilization of gluteals    [facility] Adult PT Treatment:                                                DATE: 08/22/22   Therapeutic Exercise: Demonstrated and issued initial HEP.      Therapeutic Activity: Education on assessment findings that will be addressed throughout duration of POC.          PATIENT EDUCATION:  Education details:updated HEP, see impression  Person educated: Patient Education method: Explanation, Demonstration, Tactile cues, Verbal cues, and Handouts Education comprehension: verbalized understanding, returned demonstration, verbal cues required, tactile cues required, and needs further education   HOME EXERCISE PROGRAM: Access Code: [email or web address] URL: [email or web address] Date: 08/22/2022 Prepared by: Letitia Libra   Exercises - Supine Lower Trunk Rotation  - 1 x daily - 7 x weekly - 1 sets - 10 reps - 5 sec  hold - Modified Thomas Stretch  - 1 x daily - 7 x weekly - 1 sets - 1 min hold   ASSESSMENT:   CLINICAL IMPRESSION: Patient arrives without reports of pain. She does report one episode of fecal incontinence on Saturday without known cause. She denies any saddle paresthesia and no other episodes of bowel/bladder incontinence reported. It was recommended that she discuss this with her PCP as well as educated patient on red flag symptoms that would warrant urgent care with patient verbalizing understanding. Reviewed HEP with patient initially reporting groin pain with hip flexor stretch, but once foot was propped on stool, no pain reported. Able to introduce core and hip strengthening today with good tolerance without reports of pain. Rt hip flexion AROM has improved compared to initial evaluation, having met this STG. HEP was updated to include strengthening.      OBJECTIVE IMPAIRMENTS: Abnormal gait, decreased activity tolerance, decreased endurance, decreased knowledge of condition, difficulty walking, decreased ROM, decreased  strength, increased fascial restrictions, impaired flexibility, improper body mechanics, postural dysfunction, and pain.    ACTIVITY LIMITATIONS: bending, sitting, standing, squatting, and locomotion level   PARTICIPATION LIMITATIONS: driving, community activity, and occupation   PERSONAL FACTORS: Age, Fitness, Profession, Time since onset of injury/illness/exacerbation, and 1 comorbidity: see PMH above  are also affecting patient's functional outcome.    REHAB POTENTIAL: Good   CLINICAL DECISION MAKING: Evolving/moderate complexity   EVALUATION COMPLEXITY: Moderate     GOALS: Goals reviewed with patient? Yes   SHORT TERM GOALS: Target date: 09/19/2022     Patient will be independent and compliant with initial HEP.    Baseline: see above Goal status: INITIAL   2.  Patient will demonstrate at least 95 degrees of Rt hip flexion AROM to improve sitting tolerance.  Baseline: see above  Goal status: met   3.  Patient will demonstrate pain free lumbar flexion AROM to improve ability to complete bending/lifting activity.  Baseline: see above  Goal status: INITIAL   LONG TERM GOALS: Target date:  10/19/22   Patient will score at least 66% on FOTO to signify clinically meaningful improvement in functional abilities.    Baseline: see above Goal status: INITIAL   2.  Patient will demonstrate 4+/5 bilateral hip abductor/extensor strength to improve stability about the chain with prolonged walking/standing activity.  Baseline: see above Goal status: INITIAL   3.  Patient will report pain at worst rated as </=4/10 to reduce her current functional limitations.  Baseline: see above Goal status: INITIAL   4.  Patient will be independent with advanced home program to assist in management of her chronic condition.  Baseline: see above Goal status: INITIAL     PLAN:   PT FREQUENCY: 1-2x/week   PT DURATION: 8 weeks   PLANNED INTERVENTIONS: Therapeutic exercises, Therapeutic  activity, Neuromuscular re-education, Balance training, Gait training, Patient/Family education, Self Care, Joint mobilization, Aquatic Therapy, Dry Needling, Cryotherapy, Moist heat, Traction, Manual therapy, and Re-evaluation.   PLAN FOR NEXT SESSION: review and progress HEP prn; manual to Rt hip, hip strengthening, core strengthening    Letitia Libra, PT, DPT, ATC 08/26/22 11:03 AM

## 2022-08-30 ENCOUNTER — Ambulatory Visit: Payer: 59

## 2022-08-30 DIAGNOSIS — M25551 Pain in right hip: Secondary | ICD-10-CM

## 2022-08-30 DIAGNOSIS — M5459 Other low back pain: Secondary | ICD-10-CM | POA: Diagnosis not present

## 2022-08-30 DIAGNOSIS — M6281 Muscle weakness (generalized): Secondary | ICD-10-CM

## 2022-08-30 NOTE — Therapy (Addendum)
OUTPATIENT PHYSICAL THERAPY TREATMENT NOTE PHYSICAL THERAPY DISCHARGE SUMMARY  Visits from Start of Care: 3  Current functional level related to goals / functional outcomes: See goals below   Remaining deficits: Status unknown   Education / Equipment: N/A   Patient agrees to discharge. Patient goals were partially met. Patient is being discharged due to not returning since the last visit.   Patient Name: Krystal Bauer MRN: [id number] DOB:October 22, 1970, 52 y.o., female Today's Date: 08/30/2022  PCP: Renford Dills, MD  REFERRING PROVIDER: Christena Deem, MD   END OF SESSION:   PT End of Session - 08/30/22 1510     Visit Number 3    Number of Visits 17    Date for PT Re-Evaluation 10/19/22    Authorization Type [employer]    PT Start Time 1510    PT Stop Time 1555    PT Time Calculation (min) 45 min    Activity Tolerance Patient tolerated treatment well    Behavior During Therapy [facility] for tasks assessed/performed             Past Medical History:  Diagnosis Date   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   Complication of anesthesia    pt feels she has prolonged effects of anes, lethargy and feels 'woozey"   Essential hypertension 04/17/2020   History of PCOS    Medical history non-contributory    Ovarian cyst    Past Surgical History:  Procedure Laterality Date   DILATATION & CURRETTAGE/HYSTEROSCOPY WITH RESECTOCOPE N/A 12/03/2012   Procedure: DILATATION & CURETTAGE/HYSTEROSCOPY WITH RESECTION OF ENDOMETRIAL POLYP and fibroid;  Surgeon: Kirkland Hun, MD;  Location: [facility];  Service: Gynecology;  Laterality: N/A;   DILATION AND CURETTAGE OF UTERUS     x3   IR ANGIOGRAM PELVIS SELECTIVE OR SUPRASELECTIVE  07/12/2019   IR ANGIOGRAM PELVIS SELECTIVE OR SUPRASELECTIVE  07/12/2019   IR ANGIOGRAM SELECTIVE EACH ADDITIONAL VESSEL  07/12/2019   IR ANGIOGRAM SELECTIVE EACH ADDITIONAL VESSEL  07/12/2019   IR EMBO TUMOR ORGAN ISCHEMIA INFARCT INC GUIDE ROADMAPPING  07/12/2019   IR RADIOLOGIST  EVAL & MGMT  04/22/2019   IR RADIOLOGIST EVAL & MGMT  08/10/2019   IR RADIOLOGIST EVAL & MGMT  02/16/2020   IR US GUIDE VASC ACCESS RIGHT  07/12/2019   OVARIAN CYST SURGERY     WISDOM TOOTH EXTRACTION     Patient Active Problem List   Diagnosis Date Noted   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   Renal artery stenosis (HCC) 09/14/2021   Hyperlipidemia 05/31/2020   Essential hypertension 04/17/2020   Uterine fibroid 07/12/2019   Obesity 05/02/2014   Rash and nonspecific skin eruption 05/02/2014   Bilateral polycystic ovarian syndrome 02/01/2008    REFERRING DIAG: M54.50 (ICD-10-CM) - Low back pain, unspecified   THERAPY DIAG:  Other low back pain  Muscle weakness (generalized)  Pain in right hip  Rationale for Evaluation and Treatment Rehabilitation  PERTINENT HISTORY: Bradycardia Hypertension   PRECAUTIONS: none   SUBJECTIVE:  SUBJECTIVE STATEMENT:  Patient reports continued R hip pain, states that the manual work last session was very helpful.    PAIN:  Are you having pain? None currently; NPRS scale: 4 at worst/10 Pain location: Rt anterior hip Pain description: twinge Aggravating factors: walking, transitioning from sitting to standing, prolonged sitting Relieving factors: husband will pull her up (traction)   OBJECTIVE: (objective measures completed at initial evaluation unless otherwise dated)  DIAGNOSTIC FINDINGS:  Lumbar X-ray: IMPRESSION: 1. Moderate lower lumbar facet hypertrophy. 2. Mild multilevel spondylosis with endplate spurring. 3. Incidental note of normal variant anatomy. 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   PATIENT SURVEYS:  FOTO 50% function to 66% predicted   SCREENING FOR RED FLAGS: Bowel or bladder incontinence: No Spinal tumors: No Cauda equina  syndrome: No Compression fracture: No Abdominal aneurysm: No   COGNITION: Overall cognitive status: Within functional limits for tasks assessed                          SENSATION: Not tested   MUSCLE LENGTH: Hamstrings: WNL bilaterally  Piriformis: moderate tightness bilaterally    POSTURE: increased lumbar lordosis   PALPATION: TTP bilateral lumbar paraspinals    LUMBAR ROM:    AROM eval  Flexion Full pn on ascent   Extension 25% limited LBP  Right lateral flexion 25% limited  Left lateral flexion 25% limited   Right rotation 50% limited  Left rotation 50% limited    (Blank rows = not tested)   LOWER EXTREMITY ROM:      Active  Right eval Left eval 08/26/22 Right   Hip flexion 85 pn 100 100 pn   Hip extension       Hip abduction       Hip adduction       Hip internal rotation 30 35   Hip external rotation 25 25   Knee flexion       Knee extension       Ankle dorsiflexion       Ankle plantarflexion       Ankle inversion       Ankle eversion        (Blank rows = not tested)   LOWER EXTREMITY MMT:     MMT Right eval Left eval  Hip flexion 4- 4  Hip extension 4- 4-  Hip abduction 4- 4-  Hip adduction      Hip internal rotation 4+ 4+  Hip external rotation 4+ 4+  Knee flexion      Knee extension      Ankle dorsiflexion      Ankle plantarflexion      Ankle inversion      Ankle eversion       (Blank rows = not tested)   LUMBAR SPECIAL TESTS:  SLR (-) FABER (+) FADIR (+)  Thomas (+) Hip flexor    FUNCTIONAL TESTS:  Not tested    GAIT: Distance walked: 10 ft  Assistive device utilized: None Level of assistance: Complete Independence Comments: trendelenberg   [facility] Adult PT Treatment:                                                DATE: 08/30/22 Aquatic therapy at [facility]- [address] - therapeutic pool temp approximately 92 degrees. Pt enters building ambulating independently.  Treatment took place in water 3.8 to  4 ft 8 in.feet  deep depending upon activity.  Pt entered and exited the pool via stair and handrails independently. Patient entered water for aquatic therapy for first time and was introduced to principles and therapeutic effects of water as they ambulated and acclimated to pool.  Therapeutic Exercise: Aquatic Exercise: Walking forward/side stepping holding yellow noodle Runners stretch on bottom step x30" BIL Hamstring stretch on bottom step x30" BIL Figure 4 squat stretch, BIL UE support x30" BIL Standing thoracic rotation with kickboard x1' Standing with UE support edge of pool: Hip abd/add x20 BIL Hip ext/flex with knee straight x 20 BIL Hip Circles CC/CCW x10 each BIL Marching hip flexion to knee extension 2x10 BIL Hip flexion with ER/IR x10 BIL Hamstring curl x20 BIL Squats 2x20 Heel toe raises x20 Sitting on submerged bench: Alternating LAQ x1' Bicycle kicks x1' Scissor kicks x1' Kickboard push/pull x1' Kickboard push downs x1'  Pt requires the buoyancy of water for active assisted exercises with buoyancy supported for strengthening and AROM exercises. Hydrostatic pressure also supports joints by unweighting joint load by at least 50 % in 3-4 feet depth water. 80% in chest to neck deep water. Water will provide assistance with movement using the current and laminar flow while the buoyancy reduces weight bearing. Pt requires the viscosity of the water for resistance with strengthening exercises.   [facility] Adult PT Treatment:                                                DATE: 08/26/22 Therapeutic Exercise: LTR x 1 minute  Supine hip flexor stretch x 60 sec  Hip bridge 2 x 10  Hooklying hip abduction blue band 2 x 10  Supine posterior pelvic tilts 2 x 10; 5 sec hold  Updated HEP Manual Therapy: RLE LAD 3 x 1 minute Rt hip posterior and inferior joint mobilizations grade II-III Demo and returned demo of use of tennis ball for self-soft tissue mobilization of gluteals    [facility] Adult PT  Treatment:                                                DATE: 08/22/22   Therapeutic Exercise: Demonstrated and issued initial HEP.      Therapeutic Activity: Education on assessment findings that will be addressed throughout duration of POC.         PATIENT EDUCATION:  Education details:updated HEP, see impression  Person educated: Patient Education method: Explanation, Demonstration, Tactile cues, Verbal cues, and Handouts Education comprehension: verbalized understanding, returned demonstration, verbal cues required, tactile cues required, and needs further education   HOME EXERCISE PROGRAM: Access Code: [email or web address] URL: [email or web address] Date: 08/22/2022 Prepared by: Letitia Libra   Exercises - Supine Lower Trunk Rotation  - 1 x daily - 7 x weekly - 1 sets - 10 reps - 5 sec  hold - Modified Thomas Stretch  - 1 x daily - 7 x weekly - 1 sets - 1 min hold   ASSESSMENT:   CLINICAL IMPRESSION: Patient presents to first aquatic PT session reporting continued Rt hip pain, mostly when she is walking. She states that the manual performed last session was very helpful and  decreased her pain. Session today focused on LE strengthening and general conditioning in the aquatic environment for use of buoyancy to offload joints and the viscosity of water as resistance during therapeutic exercise. Patient was able to tolerate all prescribed exercises in the aquatic environment with no adverse effects. Patient continues to benefit from skilled PT services on land and aquatic based and should be progressed as able to improve functional independence.      OBJECTIVE IMPAIRMENTS: Abnormal gait, decreased activity tolerance, decreased endurance, decreased knowledge of condition, difficulty walking, decreased ROM, decreased strength, increased fascial restrictions, impaired flexibility, improper body mechanics, postural dysfunction, and pain.    ACTIVITY LIMITATIONS: bending, sitting,  standing, squatting, and locomotion level   PARTICIPATION LIMITATIONS: driving, community activity, and occupation   PERSONAL FACTORS: Age, Fitness, Profession, Time since onset of injury/illness/exacerbation, and 1 comorbidity: see PMH above  are also affecting patient's functional outcome.    REHAB POTENTIAL: Good   CLINICAL DECISION MAKING: Evolving/moderate complexity   EVALUATION COMPLEXITY: Moderate     GOALS: Goals reviewed with patient? Yes   SHORT TERM GOALS: Target date: 09/19/2022     Patient will be independent and compliant with initial HEP.    Baseline: see above Goal status: INITIAL   2.  Patient will demonstrate at least 95 degrees of Rt hip flexion AROM to improve sitting tolerance.  Baseline: see above  Goal status: met   3.  Patient will demonstrate pain free lumbar flexion AROM to improve ability to complete bending/lifting activity.  Baseline: see above  Goal status: INITIAL   LONG TERM GOALS: Target date: 10/19/22   Patient will score at least 66% on FOTO to signify clinically meaningful improvement in functional abilities.    Baseline: see above Goal status: INITIAL   2.  Patient will demonstrate 4+/5 bilateral hip abductor/extensor strength to improve stability about the chain with prolonged walking/standing activity.  Baseline: see above Goal status: INITIAL   3.  Patient will report pain at worst rated as </=4/10 to reduce her current functional limitations.  Baseline: see above Goal status: INITIAL   4.  Patient will be independent with advanced home program to assist in management of her chronic condition.  Baseline: see above Goal status: INITIAL     PLAN:   PT FREQUENCY: n/a   PT DURATION: n/a   PLANNED INTERVENTIONS: Therapeutic exercises, Therapeutic activity, Neuromuscular re-education, Balance training, Gait training, Patient/Family education, Self Care, Joint mobilization, Aquatic Therapy, Dry Needling, Cryotherapy, Moist  heat, Traction, Manual therapy, and Re-evaluation.   PLAN FOR NEXT SESSION:n/a   Berta Minor PTA 08/30/22 3:54 PM  Letitia Libra, PT, DPT, ATC 10/21/22 11:31 AM

## 2022-09-20 ENCOUNTER — Ambulatory Visit: Payer: 59

## 2022-09-21 ENCOUNTER — Other Ambulatory Visit: Payer: Self-pay | Admitting: Cardiovascular Disease

## 2022-09-24 NOTE — Telephone Encounter (Signed)
Please call pt to schedule overdue HTN follow-up appointment with Dr. Duke Salvia or Luther Parody, NP for refills. Last OV 09/2021, was suppsed to follow up 3 months after. Thank you!

## 2022-09-25 NOTE — Telephone Encounter (Signed)
LVM for patient to schedule HTN Clinic f/u 

## 2022-09-26 NOTE — Telephone Encounter (Signed)
Scheduled 10/24/22 with Gillian Shields, NP

## 2022-09-26 NOTE — Telephone Encounter (Signed)
Rx request sent to pharmacy.  

## 2022-10-07 ENCOUNTER — Telehealth (HOSPITAL_BASED_OUTPATIENT_CLINIC_OR_DEPARTMENT_OTHER): Payer: Self-pay | Admitting: Cardiovascular Disease

## 2022-10-07 NOTE — Telephone Encounter (Signed)
Spoke with patient regarding rescheduling the renal doppler ordered by Dr. Vela Prose states she is not ready to reschedule

## 2022-10-15 NOTE — Telephone Encounter (Signed)
Patient is not ready to reschedule the renal artery duplex---will cancel order and reinstate if patient calls to reschedule

## 2022-10-24 ENCOUNTER — Ambulatory Visit (HOSPITAL_BASED_OUTPATIENT_CLINIC_OR_DEPARTMENT_OTHER): Payer: 59 | Admitting: Family

## 2023-03-19 IMAGING — CT CT CARDIAC CORONARY ARTERY CALCIUM SCORE
3 series · 14 of 20 positions shown, 16 images · non-contrast
Comparison: None.
COMPARISON: None.

Addendum:
EXAM:
OVER-READ INTERPRETATION  CT CHEST

The following report is an over-read performed by radiologist Dr.
Gab Tiger [REDACTED] on 04/19/2021. This
over-read does not include interpretation of cardiac or coronary
anatomy or pathology. The coronary calcium score interpretation by
the cardiologist is attached.
CLINICAL DATA: Cardiovascular Disease Risk stratification
Coronary Calcium Score
TECHNIQUE: A gated, non-contrast computed tomography scan of the heart was
performed using 3mm slice thickness. Axial images were analyzed on a
dedicated workstation. Calcium scoring of the coronary arteries was
performed using the Agatston method.

[Series 2: cascseq 2.0 sa36 70% (id) · axial · 0.39mm/px · z∈[-79,+1]mm · 4 of 68 slices shown]
[im 14/68  vessel]
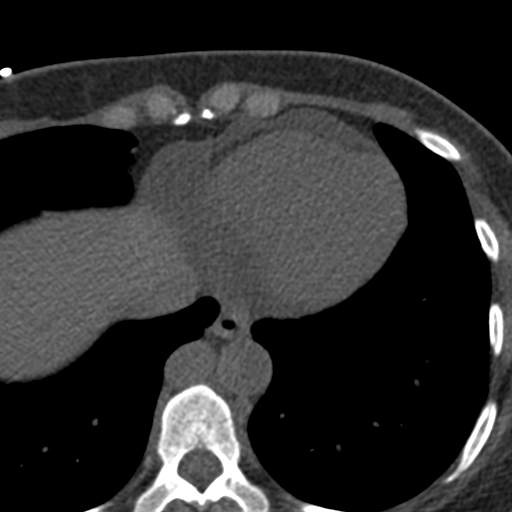
[im 27/68  vessel]
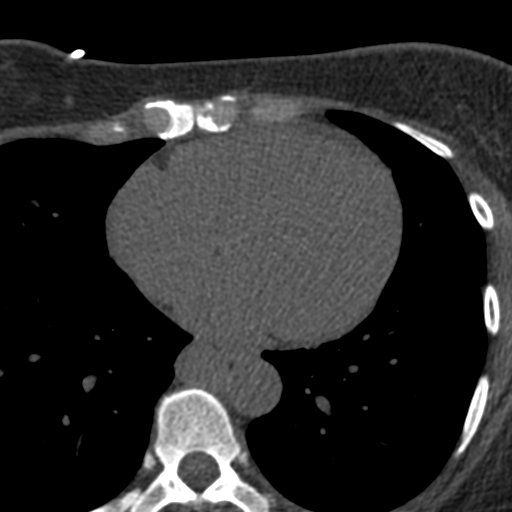
[im 41/68  vessel]
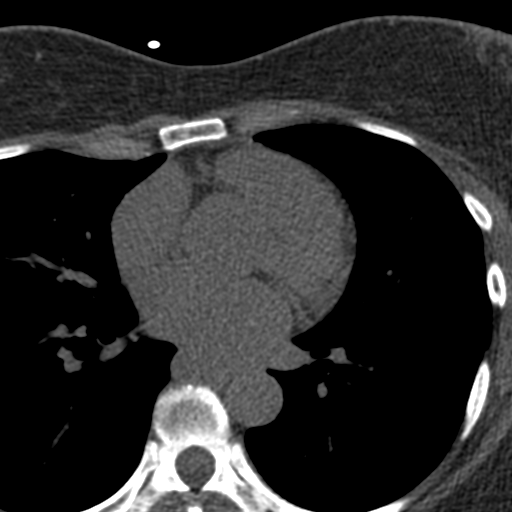
[im 54/68  vessel]
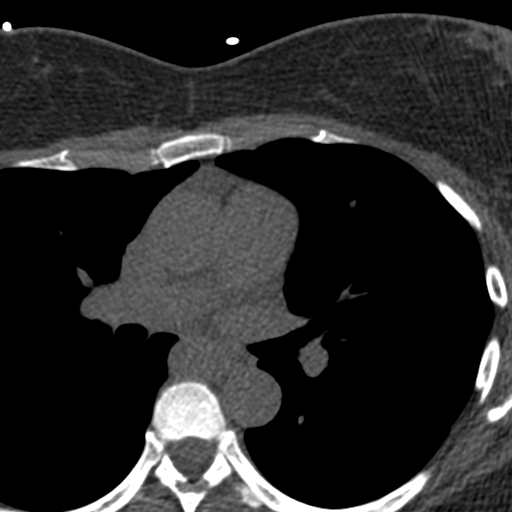

[Series 3: cascseq 2.0 bf37 st · axial · 0.64mm/px · z∈[-83,+5]mm · 5 of 68 slices shown, 7 images]
[im 12/68  vessel]
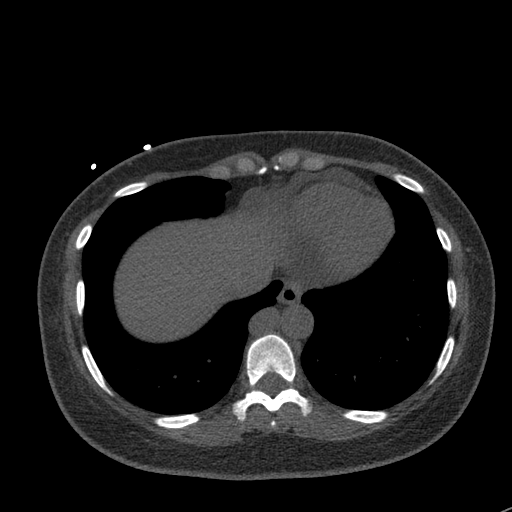
[im 12/68  lung]
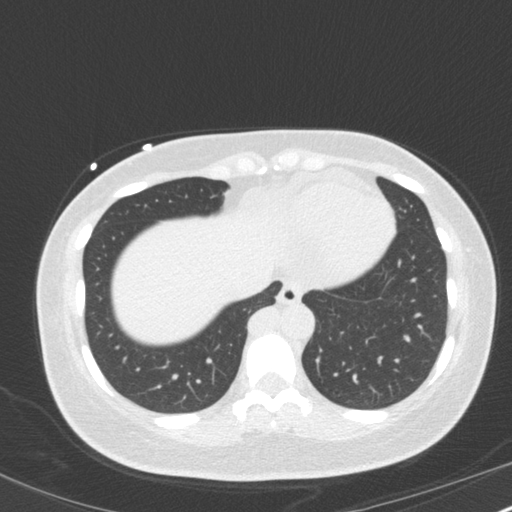
[im 23/68  vessel]
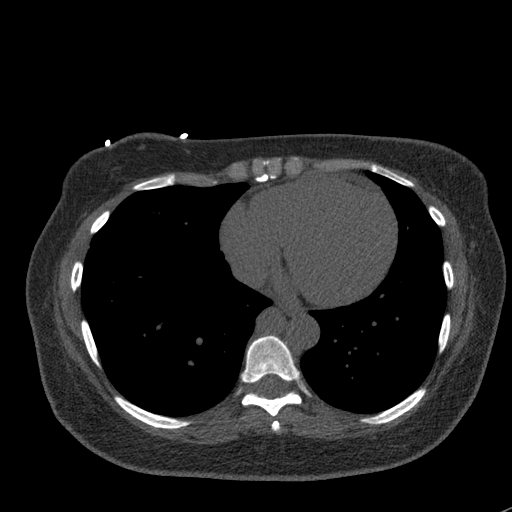
[im 34/68  vessel]
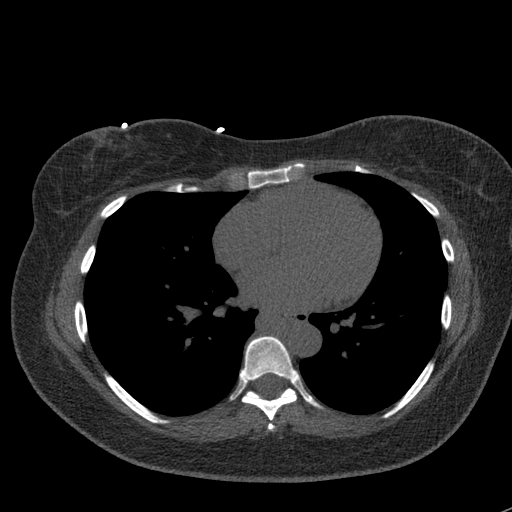
[im 45/68  vessel]
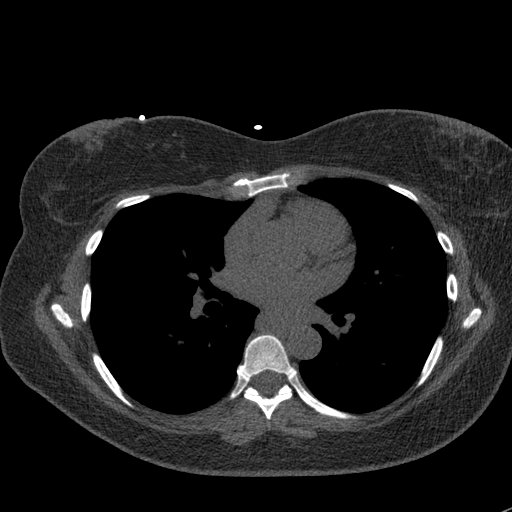
[im 56/68  vessel]
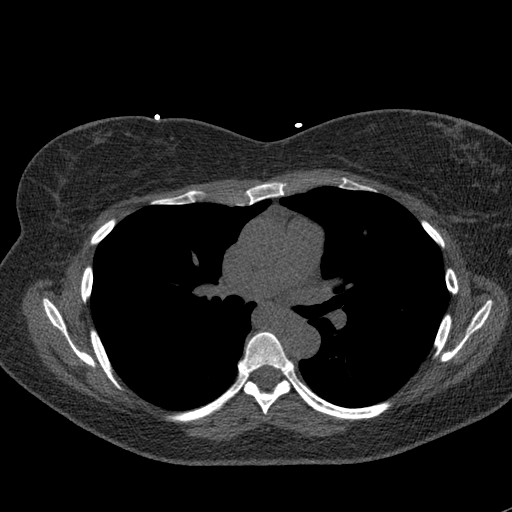
[im 56/68  lung]
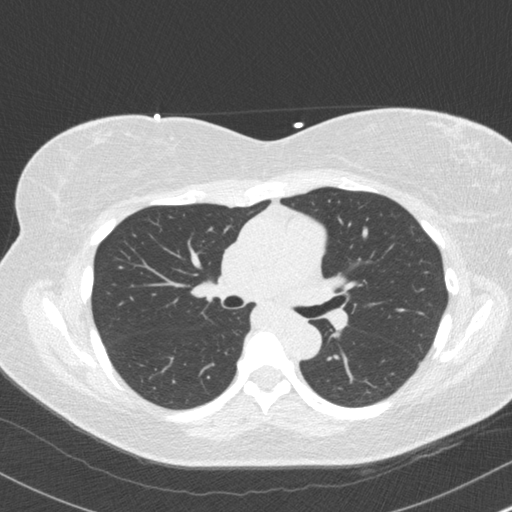

[Series 4: cascseq 2.0 br59 lung · axial · 0.64mm/px · z∈[-83,+5]mm · 5 of 68 slices shown]
[im 12/68  lung]
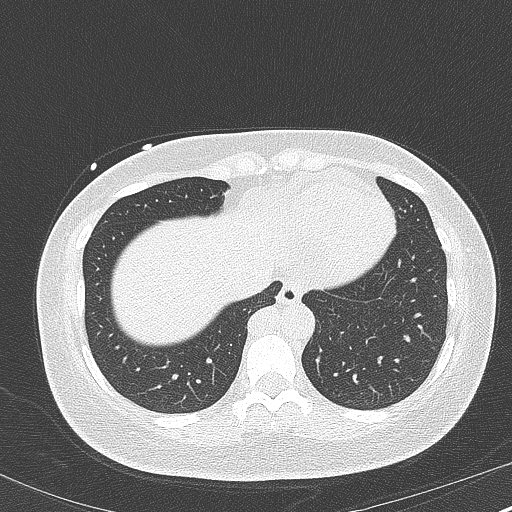
[im 23/68  lung]
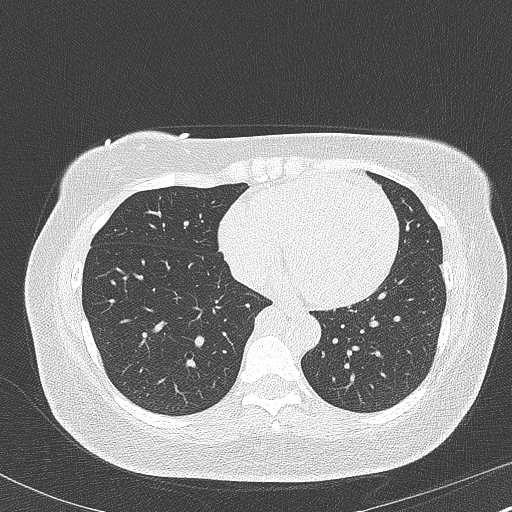
[im 34/68  lung]
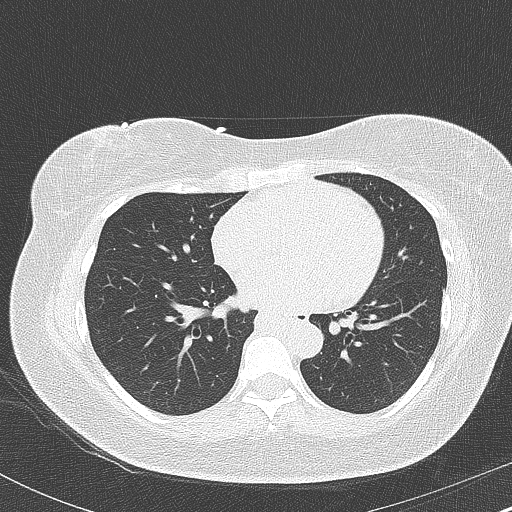
[im 45/68  lung]
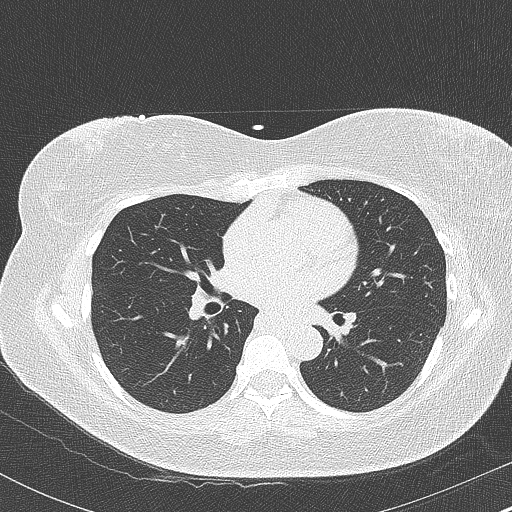
[im 56/68  lung]
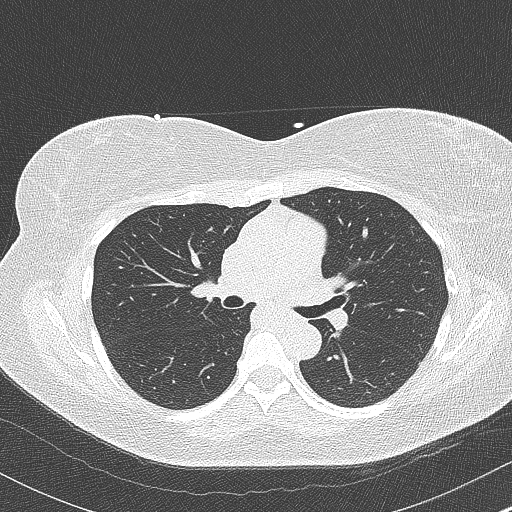

[14 of 20 positions shown; findings below may reference images not displayed]

FINDINGS: Within the visualized portions of the thorax there are no suspicious
appearing pulmonary nodules or masses, there is no acute
consolidative airspace disease, no pleural effusions, no
pneumothorax and no lymphadenopathy. Visualized portions of the
upper abdomen are unremarkable. There are no aggressive appearing
lytic or blastic lesions noted in the visualized portions of the
skeleton.
IMPRESSION: 1. No significant incidental noncardiac findings are noted.
FINDINGS: Coronary Calcium Score:

Left main: 0

Left anterior descending artery: 0

Left circumflex artery: 0

Right coronary artery: 0

Total: 0

Percentile: NA

Pericardium: Normal.

Non-cardiac: See separate report from [REDACTED].
IMPRESSION: Coronary calcium score of 0.



If CAC=0, it is reasonable to withhold statin therapy and reassess
in 5 to 10 years, as long as higher risk conditions are absent
(diabetes mellitus, family history of premature CHD in first degree
relatives (males <55 years; females <65 years), cigarette smoking,
or LDL >=190 mg/dL).

If CAC is 1 to 99, it is reasonable to initiate statin therapy for
patients >=55 years of age.

If CAC is >=100 or >=75th percentile, it is reasonable to initiate
statin therapy at any age.

Cardiology referral should be considered for patients with CAC
scores >=400 or >=75th percentile.

*2492 AHA/ACC/AACVPR/AAPA/ABC/AUJLA/ARNIE/DIDINE/Bladimir/NGUYENVANHA/NYA/RONO
Guideline on the Management of Blood Cholesterol: A Report of the
American College of Cardiology/American Heart Association Task Force
on Clinical Practice Guidelines. J Am Coll Cardiol.
8396;73(24):0667-0746.

*** End of Addendum ***
EXAM:
OVER-READ INTERPRETATION  CT CHEST

The following report is an over-read performed by radiologist Dr.
Gab Tiger [REDACTED] on 04/19/2021. This
over-read does not include interpretation of cardiac or coronary
anatomy or pathology. The coronary calcium score interpretation by
the cardiologist is attached.
FINDINGS: Within the visualized portions of the thorax there are no suspicious
appearing pulmonary nodules or masses, there is no acute
consolidative airspace disease, no pleural effusions, no
pneumothorax and no lymphadenopathy. Visualized portions of the
upper abdomen are unremarkable. There are no aggressive appearing
lytic or blastic lesions noted in the visualized portions of the
skeleton.
IMPRESSION: 1. No significant incidental noncardiac findings are noted.

## 2023-06-02 ENCOUNTER — Ambulatory Visit (HOSPITAL_BASED_OUTPATIENT_CLINIC_OR_DEPARTMENT_OTHER): Payer: 59 | Admitting: Cardiovascular Disease

## 2023-06-02 ENCOUNTER — Encounter (HOSPITAL_BASED_OUTPATIENT_CLINIC_OR_DEPARTMENT_OTHER): Payer: Self-pay | Admitting: Cardiovascular Disease

## 2023-06-02 VITALS — BP 152/88 | HR 60 | Ht 64.0 in | Wt 199.8 lb

## 2023-06-02 DIAGNOSIS — F4321 Adjustment disorder with depressed mood: Secondary | ICD-10-CM

## 2023-06-02 DIAGNOSIS — I10 Essential (primary) hypertension: Secondary | ICD-10-CM | POA: Diagnosis not present

## 2023-06-02 DIAGNOSIS — E785 Hyperlipidemia, unspecified: Secondary | ICD-10-CM | POA: Diagnosis not present

## 2023-06-02 DIAGNOSIS — I701 Atherosclerosis of renal artery: Secondary | ICD-10-CM

## 2023-06-02 HISTORY — DX: Adjustment disorder with depressed mood: F43.21

## 2023-06-02 MED ORDER — VALSARTAN 40 MG PO TABS: 40.0000 mg | ORAL_TABLET | Freq: Every day | ORAL | 3 refills | Status: AC

## 2023-06-02 MED ORDER — ESCITALOPRAM OXALATE 10 MG PO TABS: 10.0000 mg | ORAL_TABLET | Freq: Every day | ORAL | 1 refills | Status: DC

## 2023-06-02 MED ORDER — HYDROCHLOROTHIAZIDE 12.5 MG PO TABS: 12.5000 mg | ORAL_TABLET | Freq: Every day | ORAL | 3 refills | Status: AC

## 2023-06-02 NOTE — Patient Instructions (Addendum)
 Medication Instructions:  START LEXAPRO 10 MG DAILY  FOLLOW UP WITH DR POLITE FOR FURTHER REFILLS AND ABOUT GETTING COUNSELOR   STOP VALSARTAN-HCT COMBO   START VALSARTAN 40 MG DAILY   START hydrochlorothiazide 12.5 MG DAILY  Labwork: NONE   Testing/Procedures: NONE  Follow-Up: 2 MONTHS   If you need a refill on your cardiac medications before your next appointment, please call your pharmacy.

## 2023-06-02 NOTE — Progress Notes (Signed)
 Advanced Hypertension Clinic Follow-up:    Date:  06/02/2023   ID:  [name], DOB May 30, 1970, MRN [id number]  PCP:  Renford Dills, MD  Cardiologist:  None  Nephrologist:  Referring MD: Renford Dills, MD   CC: Hypertension  History of Present Illness:    Krystal Bauer is a 53 y.o. female with a hx of hypertension, hyperlipidemia, mild renal artery stenosis, PCOS and fibroids here follow-up. She initially established care in the advanced hypertension clinic 04/17/2020. April 2021 she had a uterine fibroid procedure.  Following the procedure she struggled with headaches.  She checked her BP and it was 190s/100s.  She went to the ED and it was 200/118.  She was started on HCTZ and losartan was subsequently added. She was referred to [facility]. Renal artery dopplers 04/2020 revealed mild blockage on the right and none on the left. Renin and aldosterone were within normal limits. Thyroid function was normal. Losartan/HCTZ was increased, however she stopped taking it due to a rash. When she saw our pharmacist, she was not taking any medicines and was started on amlodipine. She was also started on rosuvastatin for her lipids.  At her last appointment she was doing well and following a pescatarian diet.  Her blood pressures were well controlled.  She had a coronary calcium score 04/2021 with a score of 0.  Repeat renal artery Dopplers 05/2021 showed mild blockage on the right and none on the left.  At her appointment 09/2021 blood pressure was well-controlled on valsartan/HCTZ.  She was bradycardic and fatigued.  Thyroid functioning was normal.    Today [name] presents with elevated blood pressure and stress management issues.  She has a history of hypertension, initially discovered with a blood pressure reading of 200/100 mmHg. She is sensitive to medications, with previous treatments causing hypotension. Due to these fluctuations, she takes her medication irregularly,  currently at a half dose, adjusting based on her readings. Recent blood pressure measurements have been 128/97 mmHg and 140/100 mmHg.  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 She acknowledges high stress levels, believing they contribute to her elevated blood pressure.  Physical symptoms potentially related to stress include hip arthritis, which previously required a cane. Although improved, she still experiences occasional discomfort, reducing her physical activity. She is currently teaching multiple labs, which adds to her stress and feels overwhelming.  She has experienced skin issues, such as peeling on the palms and soles, referred to as 'AEs'. These symptoms have been present since starting her blood pressure medication, but the direct relationship is unclear.  She has a history of situational depression, having been on medication after her mother's passing. Currently, she is not on medication for depression or anxiety but is considering it due to her current stress levels.      Past Medical History:  Diagnosis Date   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   Complication of anesthesia    pt feels she has prolonged effects of anes, lethargy and feels 'woozey"   Essential hypertension 04/17/2020   History of PCOS    Medical history non-contributory    Ovarian cyst    Situational depression 06/02/2023    Past Surgical History:  Procedure Laterality Date   DILATATION & CURRETTAGE/HYSTEROSCOPY WITH RESECTOCOPE N/A 12/03/2012   Procedure: DILATATION & CURETTAGE/HYSTEROSCOPY WITH RESECTION OF ENDOMETRIAL POLYP and fibroid;  Surgeon: Kirkland Hun, MD;  Location: [facility];  Service: Gynecology;  Laterality: N/A;  DILATION AND CURETTAGE OF UTERUS     x3   IR ANGIOGRAM PELVIS SELECTIVE OR SUPRASELECTIVE  07/12/2019   IR ANGIOGRAM PELVIS SELECTIVE OR SUPRASELECTIVE  07/12/2019   IR  ANGIOGRAM SELECTIVE EACH ADDITIONAL VESSEL  07/12/2019   IR ANGIOGRAM SELECTIVE EACH ADDITIONAL VESSEL  07/12/2019   IR EMBO TUMOR ORGAN ISCHEMIA INFARCT INC GUIDE ROADMAPPING  07/12/2019   IR RADIOLOGIST EVAL & MGMT  04/22/2019   IR RADIOLOGIST EVAL & MGMT  08/10/2019   IR RADIOLOGIST EVAL & MGMT  02/16/2020   IR US GUIDE VASC ACCESS RIGHT  07/12/2019   OVARIAN CYST SURGERY     WISDOM TOOTH EXTRACTION      Current Medications: Current Meds  Medication Sig   escitalopram (LEXAPRO) 10 MG tablet Take 1 tablet (10 mg total) by mouth daily.   hydrochlorothiazide (HYDRODIURIL) 12.5 MG tablet Take 1 tablet (12.5 mg total) by mouth daily.   valsartan (DIOVAN) 40 MG tablet Take 1 tablet (40 mg total) by mouth daily.   [DISCONTINUED] valsartan-hydrochlorothiazide (DIOVAN-HCT) 160-25 MG tablet TAKE 1/2 TABLET BY MOUTH DAILY     Allergies:   Hyzaar [losartan potassium-hctz], Wheat, Crestor [rosuvastatin], Latex, Sulfa antibiotics, and Sulfamethoxazole-trimethoprim   Social History   Socioeconomic History   Marital status: Single    Spouse name: Not on file   Number of children: Not on file   Years of education: Not on file   Highest education level: Not on file  Occupational History   Not on file  Tobacco Use   Smoking status: Never   Smokeless tobacco: Never  Vaping Use   Vaping status: Never Used  Substance and Sexual Activity   Alcohol use: Yes    Comment: socially    Drug use: No   Sexual activity: Not Currently    Birth control/protection: Abstinence  Other Topics Concern   Not on file  Social History Narrative   Not on file   Social Drivers of Health   Financial Resource Strain: Low Risk  (04/12/2021)   Overall Financial Resource Strain (CARDIA)    Difficulty of Paying Living Expenses: Not hard at all  Food Insecurity: No Food Insecurity (04/12/2021)   Hunger Vital Sign    Worried About Running Out of Food in the Last Year: Never true    Ran Out of Food in the Last Year: Never  true  Transportation Needs: No Transportation Needs (04/12/2021)   PRAPARE - Administrator, Civil Service (Medical): No    Lack of Transportation (Non-Medical): No  Physical Activity: Sufficiently Active (04/12/2021)   Exercise Vital Sign    Days of Exercise per Week: 3 days    Minutes of Exercise per Session: 60 min  Stress: No Stress Concern Present (07/10/2020)   [employer] of Occupational Health - Occupational Stress Questionnaire    Feeling of Stress : Not at all  Recent Concern: Stress - Stress Concern Present (06/14/2020)   [employer] of Occupational Health - Occupational Stress Questionnaire    Feeling of Stress : To some extent  Social Connections: Not on file     Family History: The patient's family history includes Asthma in her mother; Diabetes in her father and mother; Heart Problems in her father; Hypertension in her father and mother; Parkinson's disease in her mother; Prostate cancer in her brother.  ROS:   Please see the history of present illness.    (+) Stress (+) Skin rash All other systems reviewed and are negative.  EKGs/Labs/Other Studies  Reviewed:    Bilateral Renal Artery Duplex 05/08/2020: Summary:  Largest Aortic Diameter: 2.1 cm     Renal:     Right: Normal size right kidney. Normal right Resistive Index.         Normal cortical thickness of right kidney. 1-59% stenosis of         the right renal artery. RRV flow present.  Left:  Normal size of left kidney. Normal left Resistive Index.         Normal cortical thickness of the left kidney. No evidence of         left renal artery stenosis. LRV flow present.  Mesenteric:  Normal Celiac artery and Superior Mesenteric artery findings.     Patent IVC.   EKG:   09/14/2021: Sinus bradycardia.  Rate 49 bpm. 04/17/2020: sinus rhythm.  Rate 71 bpm.   Recent Labs: No results found for requested labs within last 365 days.   Recent Lipid Panel    Component Value Date/Time   CHOL 195  05/09/2021 1143   TRIG 53 05/09/2021 1143   HDL 63 05/09/2021 1143   CHOLHDL 3.1 05/09/2021 1143   CHOLHDL 3.5 02/24/2012 1027   VLDL 13 02/24/2012 1027   LDLCALC 122 (H) 05/09/2021 1143    Physical Exam:    VS:  BP (!) 152/88   Pulse 60   Ht 5\' 4"  (1.626 m)   Wt 199 lb 12.8 oz (90.6 kg)   SpO2 98%   BMI 34.30 kg/m  , BMI Body mass index is 34.3 kg/m. GENERAL:  Well appearing HEENT: Pupils equal round and reactive, fundi not visualized, oral mucosa unremarkable NECK:  No jugular venous distention, waveform within normal limits, carotid upstroke brisk and symmetric, no bruits LUNGS:  Clear to auscultation bilaterally HEART:  RRR.  PMI not displaced or sustained,S1 and S2 within normal limits, no S3, no S4, no clicks, no rubs, no murmurs ABD:  Flat, positive bowel sounds normal in frequency in pitch, no bruits, no rebound, no guarding, no midline pulsatile mass, no hepatomegaly, no splenomegaly EXT:  2 plus pulses throughout, no edema, no cyanosis no clubbing SKIN:  No rashes no nodules NEURO:  Cranial nerves II through XII grossly intact, motor grossly intact throughout PSYCH:  Cognitively intact, oriented to person place and time  ASSESSMENT:    1. Essential hypertension   2. Renal artery stenosis (HCC)   3. Hyperlipidemia, unspecified hyperlipidemia type   4. Situational depression     PLAN:    # Hypertension Blood pressure fluctuating with stress and inconsistent medication use. Patient reported adverse effects with current medication (Valsartan/HCTZ 160/25mg ) including skin peeling and dryness. Blood pressure readings ranging from 128/97 to 140/100. -Change medication to Valsartan 40mg  and HCTZ 12.5mg  daily to maintain a steady state and avoid hypotension. -Check blood pressure regularly and adjust medication as needed.  # Chronic Stress Patient experiencing significant life stressors including job instability, marital issues, and financial strain. This is likely  contributing to hypertension and overall health. -Recommend starting Lexapro 10mg  daily (SSRI) to help manage stress and potential situational depression. -Encourage patient to seek therapy for additional support and stress management techniques. -Plan to follow up in a couple of months to assess patient's situation and adjust treatment as necessary.  # Arthritis Patient reported hip arthritis causing mobility issues, but has seen improvement recently. -Encourage continued physical activity as tolerated to maintain mobility and overall health.     Disposition:    FU with Elianis Fischbach C.  Duke Salvia, MD, [facility] in 2 months   Medication Adjustments/Labs and Tests Ordered: Current medicines are reviewed at length with the patient today.  Concerns regarding medicines are outlined above.   Orders Placed This Encounter  Procedures   EKG 12-Lead   Meds ordered this encounter  Medications   valsartan (DIOVAN) 40 MG tablet    Sig: Take 1 tablet (40 mg total) by mouth daily.    Dispense:  90 tablet    Refill:  3    D/C VALSARTAN HCT COMBO   hydrochlorothiazide (HYDRODIURIL) 12.5 MG tablet    Sig: Take 1 tablet (12.5 mg total) by mouth daily.    Dispense:  90 tablet    Refill:  3    D/C VALSARTAN HCT COMBO   escitalopram (LEXAPRO) 10 MG tablet    Sig: Take 1 tablet (10 mg total) by mouth daily.    Dispense:  30 tablet    Refill:  1   Signed, Chilton Si, MD  06/02/2023 1:11 PM    [facility]

## 2023-07-26 ENCOUNTER — Other Ambulatory Visit (HOSPITAL_BASED_OUTPATIENT_CLINIC_OR_DEPARTMENT_OTHER): Payer: Self-pay | Admitting: Cardiovascular Disease

## 2023-07-28 ENCOUNTER — Encounter (HOSPITAL_BASED_OUTPATIENT_CLINIC_OR_DEPARTMENT_OTHER): Payer: 59 | Admitting: Cardiovascular Disease

## 2023-07-28 ENCOUNTER — Ambulatory Visit (HOSPITAL_BASED_OUTPATIENT_CLINIC_OR_DEPARTMENT_OTHER): Payer: 59 | Admitting: Cardiovascular Disease

## 2023-09-30 ENCOUNTER — Encounter (HOSPITAL_BASED_OUTPATIENT_CLINIC_OR_DEPARTMENT_OTHER): Payer: Self-pay | Admitting: Cardiovascular Disease

## 2023-09-30 ENCOUNTER — Ambulatory Visit (HOSPITAL_BASED_OUTPATIENT_CLINIC_OR_DEPARTMENT_OTHER): Admitting: Cardiovascular Disease

## 2023-09-30 VITALS — BP 129/84 | HR 67 | Resp 100 | Ht 64.0 in | Wt 195.0 lb

## 2023-09-30 DIAGNOSIS — I701 Atherosclerosis of renal artery: Secondary | ICD-10-CM | POA: Diagnosis not present

## 2023-09-30 DIAGNOSIS — I10 Essential (primary) hypertension: Secondary | ICD-10-CM | POA: Diagnosis not present

## 2023-09-30 NOTE — Progress Notes (Signed)
 Heart and Vascular Care Navigation  09/30/2023  Krystal Bauer 1970-06-04 [id number]  Reason for Referral: community resources for stressors Patient is participating in a Managed Medicaid Plan: No, Engineer, building services  Engaged with patient by telephone for initial visit for Heart and Vascular Care Coordination.                                                                                                   Assessment:     LCSW met with pt today in clinic to provide support and resources for current stressors. Introduced self, role, reason for visit and provided my card. Pt confirmed home address and would like to add her brother Fae Lesches to her emergency contact list. 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 She has access to transportation and her medications. Her insurance is currently through her husband's employer. Pt shares that she and her husband keep essentially separate finances and she supports the household entirely with her income- even after he no longer had other financial responsibilities (child support). This has been a source of stress for her along with marital issues. Pt shares that her husband monitors her calls and whereabouts and even though she does not feel unsafe she feels like she's walking on eggshells. We discussed that her safety is our priority- and that includes her feeling emotionally and mentally safe as well as physically safe. Due to the nature of their relationship right now she doesn't feel comfortable having any resources sent to her house and has requested I send resources to an alternate phone number. I have sent her information about [facility] and [employer]- these programs have access to legal services, safety planning, and other supportive resources should she be seeking support for finances or planning for next steps. I shared  with her she should call [facility] or 911 if any concerns escalate. We are available during clinic hours for any additional resources that she may seek for financial stress or relationship/emotional support.   Inquired if pt felt that therapy or therapeutic support would be something she is interested in right now to help her along with the medication she has started- pt shares that the above noted concerns have kept her financially from going to therapy and she doesn't feel comfortable with him knowing. We discussed [facility] program through [facility] that is a free Psychoeducational program that meets in person and virtually- offered that I could refer her there and they will provide her a referral for therapeutic support formally after their program if interested. She declines at this time.   No additional questions for this Clinical research associate. Allowed her space to share what she felt comfortable with and encouraged her to keep in touch with me and clinic should she need someone to talk to or additional resources.  HRT/VAS Care Coordination     Patients Home Cardiology Office --  [facility]   Outpatient Care Team Social Worker   Social Worker Name: Marit Lark, [place], [phone]   Living arrangements for the past 2 months Single Family Home   Lives with: Spouse   Patient Current Insurance Scientist, water quality   Patient Has Concern With Paying Medical Bills Yes   Patient Concerns With Medical Bills copays sometimes are expensive   Medical Bill Referrals: community resources provided to cover other expenses   Does Patient Have Prescription Coverage? Yes   Home Assistive Devices/Equipment Eyeglasses       Social History:                                                                             SDOH Screenings   Food Insecurity: No Food Insecurity (09/30/2023)  Housing: Low Risk  (09/30/2023)  Transportation Needs: No Transportation Needs (09/30/2023)   Utilities: Not At Risk (09/30/2023)  Alcohol Screen: Low Risk  (04/12/2021)  Financial Resource Strain: Medium Risk (09/30/2023)  Physical Activity: Sufficiently Active (04/12/2021)  Stress: No Stress Concern Present (07/10/2020)  Recent Concern: Stress - Stress Concern Present (06/14/2020)  Tobacco Use: Low Risk  (09/30/2023)  Health Literacy: Adequate Health Literacy (09/30/2023)    SDOH Interventions: Financial Resources:  Surveyor, quantity Strain Interventions: [employer] Provided [employer] of community resources for rent/mortgage/utility assistance that may help free up money for copays and assistance with costs of living  Food Insecurity:  Food Insecurity Interventions: Intervention Not Indicated  Housing Insecurity:  Housing Interventions: Programmer, applications Provided  Transportation:   Transportation Interventions: Intervention Not Indicated     Other Care Navigation Interventions:     Provided Pharmacy assistance resources  Pt covered by commercial plan at this time  Patient expressed Mental Health concerns Yes, Referred to:  [employer] program at [facility] for [employer] since pt concerned about copays etc for traditional therapy- pt declined at this time due to complexity of her relationship dynamics. Encouraged her to let me know should additional resources be desired  Patient Referred to: [facility] and [employer]-    Follow-up plan:   LCSW has sent pt resources as requested ([facility] number, [facility] number, and rent/utility assistance list), she was encouraged to seek support should she feel her safety is compromised or additional resources in community needed. Pt provided with my card. I will try and check in with pt as able to provide additional support.

## 2023-09-30 NOTE — Patient Instructions (Signed)
 Medication Instructions:  Your physician recommends that you continue on your current medications as directed. Please refer to the Current Medication list given to you today.  Follow-Up: Please follow up in  6 months in ADV HTN CLINIC with Dr. Theodis Fiscal, Neomi Banks, NP or Kristin Alvstad PharmD

## 2023-09-30 NOTE — Progress Notes (Signed)
 Advanced [facility]pertension [facility] Follow-up:    Date:  09/30/2023   ID:  Krystal Bauer, DOB 1971-01-10, MRN [id number]  PCP:  Rexanne Ingle, MD  Cardiologist:  None  Nephrologist:  Referring MD: Rexanne Ingle, MD   CC: Hypertension  History of Present Illness:    Krystal Bauer is a 53 y.o. female with a hx of hypertension, hyperlipidemia, mild renal artery stenosis, PCOS and fibroids here follow-up. She initially established care in the [facility] 04/17/2020. April 2021 she had a uterine fibroid procedure.  Following the procedure she struggled with headaches.  She checked her BP and it was 190s/100s.  She went to the ED and it was 200/118.  She was started on HCTZ and losartan  was subsequently added. She was referred to [facility]. Renal artery dopplers 04/2020 revealed mild blockage on the right and none on the left. Renin and aldosterone were within normal limits. Thyroid  function was normal. Losartan /HCTZ was increased, however she stopped taking it due to a rash. When she saw our pharmacist, she was not taking any medicines and was started on amlodipine . She was also started on rosuvastatin  for her lipids.  Krystal Bauer worked on her diet and was following a Chief Technology Officer.  Her blood pressures were well controlled.  She had a coronary calcium  score 04/2021 with a score of 0.  Repeat renal artery Dopplers 05/2021 showed mild blockage on the right and none on the left.  At her appointment 09/2021 blood pressure was well-controlled on valsartan /HCTZ.  She was bradycardic and fatigued.  Thyroid  functioning was normal.    At her visit 05/2023 she was adjusting her medications and taking half doses in order to avoid hypotension.  Blood pressure in the office was 152/88.  She noted significant flexion ablation and her blood pressure was stressed.  She felt skin peeling and dryness on valsartan /HCTZ.  The valsartan  dose was reduced, as was the hydrochlorothiazide .   She was also started on Lexapro  for stress and anxiety.  Discussed the use of AI scribe software for clinical note transcription with the patient, who gave verbal consent to proceed.  History of Present Illness Krystal Bauer's blood pressure has generally been well-controlled, with recent readings around 116/86 mmHg and typically in the 120s range. However, she noted an elevated reading upon arriving at the clinic today, which she attributes to missing one of her medications due to being out of stock.   She describes ongoing stress related to her personal life, including financial difficulties and relationship issues with her husband. She missed her last appointment due to financial constraints and has been experiencing significant stress due to her husband's behavior, including inappropriate communications with another person. She has taken steps to protect her privacy, including acquiring a second phone.  She is trying to improve her lifestyle by eating better and engaging in physical activities such as walking and line dancing, despite experiencing hip pain. She participates in online physical therapy exercises provided by her husband's insurance. Her hip pain sometimes limits her ability to walk.  She is currently taking escitalopram  for anxiety and reports that it is helping her manage her symptoms. She identifies as an 'anxiety eater' and is working on being more mindful of her eating habits.  Past Medical History:  Diagnosis Date   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   Complication of anesthesia    pt feels she has prolonged effects of anes, lethargy and feels 'woozey   Essential hypertension 04/17/2020   History of PCOS  Medical history non-contributory    Ovarian cyst    Situational depression 06/02/2023    Past Surgical History:  Procedure Laterality Date   DILATATION & CURRETTAGE/HYSTEROSCOPY WITH RESECTOCOPE N/A 12/03/2012   Procedure: DILATATION & CURETTAGE/HYSTEROSCOPY WITH  RESECTION OF ENDOMETRIAL POLYP and fibroid;  Surgeon: Rome Rigg, MD;  Location: [facility];  Service: Gynecology;  Laterality: N/A;   DILATION AND CURETTAGE OF UTERUS     x3   IR ANGIOGRAM PELVIS SELECTIVE OR SUPRASELECTIVE  07/12/2019   IR ANGIOGRAM PELVIS SELECTIVE OR SUPRASELECTIVE  07/12/2019   IR ANGIOGRAM SELECTIVE EACH ADDITIONAL VESSEL  07/12/2019   IR ANGIOGRAM SELECTIVE EACH ADDITIONAL VESSEL  07/12/2019   IR EMBO TUMOR ORGAN ISCHEMIA INFARCT INC GUIDE ROADMAPPING  07/12/2019   IR RADIOLOGIST EVAL & MGMT  04/22/2019   IR RADIOLOGIST EVAL & MGMT  08/10/2019   IR RADIOLOGIST EVAL & MGMT  02/16/2020   IR US  GUIDE VASC ACCESS RIGHT  07/12/2019   OVARIAN CYST SURGERY     WISDOM TOOTH EXTRACTION      Current Medications: Current Meds  Medication Sig   escitalopram  (LEXAPRO ) 10 MG tablet TAKE 1 TABLET BY MOUTH EVERY DAY   hydrochlorothiazide  (HYDRODIURIL ) 12.5 MG tablet Take 1 tablet (12.5 mg total) by mouth daily.   valsartan  (DIOVAN ) 40 MG tablet Take 1 tablet (40 mg total) by mouth daily.     Allergies:   Hyzaar [losartan  potassium-hctz], Wheat, Crestor  [rosuvastatin ], Latex, Sulfa antibiotics, and Sulfamethoxazole-trimethoprim   Social History   Socioeconomic History   Marital status: Single    Spouse name: Not on file   Number of children: Not on file   Years of education: Not on file   Highest education level: Not on file  Occupational History   Not on file  Tobacco Use   Smoking status: Never   Smokeless tobacco: Never  Vaping Use   Vaping status: Never Used  Substance and Sexual Activity   Alcohol use: Yes    Comment: socially    Drug use: No   Sexual activity: Not Currently    Birth control/protection: Abstinence  Other Topics Concern   Not on file  Social History Narrative   Not on file   Social Drivers of Health   Financial Resource Strain: Low Risk  (04/12/2021)   Overall Financial Resource Strain (CARDIA)    Difficulty of Paying Living Expenses: Not hard at  all  Food Insecurity: No Food Insecurity (04/12/2021)   Hunger Vital Sign    Worried About Running Out of Food in the Last Year: Never true    Ran Out of Food in the Last Year: Never true  Transportation Needs: No Transportation Needs (04/12/2021)   PRAPARE - Administrator, Civil Service (Medical): No    Lack of Transportation (Non-Medical): No  Physical Activity: Sufficiently Active (04/12/2021)   Exercise Vital Sign    Days of Exercise per Week: 3 days    Minutes of Exercise per Session: 60 min  Stress: No Stress Concern Present (07/10/2020)   [employer] of Occupational Health - Occupational Stress Questionnaire    Feeling of Stress : Not at all  Recent Concern: Stress - Stress Concern Present (06/14/2020)   [employer] of Occupational Health - Occupational Stress Questionnaire    Feeling of Stress : To some extent  Social Connections: Not on file     Family History: The patient's family history includes Asthma in her mother; Diabetes in her father and mother; Heart Problems in  her father; Hypertension in her father and mother; Parkinson's disease in her mother; Prostate cancer in her brother.  ROS:   Please see the history of present illness.    (+) Stress (+) Skin rash All other systems reviewed and are negative.  EKGs/Labs/Other Studies Reviewed:    Bilateral Renal Artery Duplex 05/08/2020: Summary:  Largest Aortic Diameter: 2.1 cm     Renal:     Right: Normal size right kidney. Normal right Resistive Index.         Normal cortical thickness of right kidney. 1-59% stenosis of         the right renal artery. RRV flow present.  Left:  Normal size of left kidney. Normal left Resistive Index.         Normal cortical thickness of the left kidney. No evidence of         left renal artery stenosis. LRV flow present.  Mesenteric:  Normal Celiac artery and Superior Mesenteric artery findings.     Patent IVC.   EKG:   09/14/2021: Sinus bradycardia.  Rate 49  bpm. 04/17/2020: sinus rhythm.  Rate 71 bpm.   Recent Labs: No results found for requested labs within last 365 days.   Recent Lipid Panel    Component Value Date/Time   CHOL 195 05/09/2021 1143   TRIG 53 05/09/2021 1143   HDL 63 05/09/2021 1143   CHOLHDL 3.1 05/09/2021 1143   CHOLHDL 3.5 02/24/2012 1027   VLDL 13 02/24/2012 1027   LDLCALC 122 (H) 05/09/2021 1143    Physical Exam:    VS:  BP 129/84 (BP Location: Left Arm, Patient Position: Sitting, Cuff Size: Normal)   Pulse 67   Resp (!) 100   Ht 5' 4 (1.626 m)   Wt 195 lb (88.5 kg)   HC 16 (40.6 cm)   BMI 33.47 kg/m  , BMI Body mass index is 33.47 kg/m. GENERAL:  Well appearing HEENT: Pupils equal round and reactive, fundi not visualized, oral mucosa unremarkable NECK:  No jugular venous distention, waveform within normal limits, carotid upstroke brisk and symmetric, no bruits LUNGS:  Clear to auscultation bilaterally HEART:  RRR.  PMI not displaced or sustained,S1 and S2 within normal limits, no S3, no S4, no clicks, no rubs, no murmurs ABD:  Flat, positive bowel sounds normal in frequency in pitch, no bruits, no rebound, no guarding, no midline pulsatile mass, no hepatomegaly, no splenomegaly EXT:  2 plus pulses throughout, no edema, no cyanosis no clubbing SKIN:  No rashes no nodules NEURO:  Cranial nerves II through XII grossly intact, motor grossly intact throughout PSYCH:  Cognitively intact, oriented to person place and time  ASSESSMENT/PLAN:    1. Essential hypertension   2. Renal artery stenosis (HCC)    Assessment & Plan # Hypertension Hypertension controlled with current regimen.  - Continue valsartan  and hydrochlorothiazide . - Monitor blood pressure at home regularly. - Contact provider if blood pressure consistently exceeds 130/80 mmHg. - Schedule follow-up in six months.  # Hip pain Chronic hip pain persists, limiting mobility. Physical therapy and line dancing provide relief. - Continue  physical therapy exercises and line dancing.  # Depression Depression managed with escitalopram . Personal stress may impact mental health. Advised to seek social worker support. - Continue escitalopram . - Refer to Child psychotherapist for support and resources.   Disposition:    FU with Lisaann Atha C. Raford, MD, [facility] in 6 months   Medication Adjustments/Labs and Tests Ordered: Current medicines are reviewed at length  with the patient today.  Concerns regarding medicines are outlined above.   Orders Placed This Encounter  Procedures   Referral to HRT/VAS Care Navigation   No orders of the defined types were placed in this encounter.  Signed, Annabella Scarce, MD  09/30/2023 3:52 PM    [facility]

## 2023-10-24 ENCOUNTER — Telehealth: Payer: Self-pay | Admitting: Licensed Clinical Social Worker

## 2023-10-24 NOTE — Telephone Encounter (Signed)
 H&V Care Navigation CSW Progress Note  Clinical Social Worker contacted patient by phone to f/u on resources sent. Pt confirmed receipt of resources. Remain available as needed.  Patient is participating in a Managed Medicaid Plan:  No, [employer]   SDOH Screenings   Food Insecurity: No Food Insecurity (09/30/2023)  Housing: Low Risk  (09/30/2023)  Transportation Needs: No Transportation Needs (09/30/2023)  Utilities: Not At Risk (09/30/2023)  Alcohol Screen: Low Risk  (04/12/2021)  Financial Resource Strain: Medium Risk (09/30/2023)  Physical Activity: Sufficiently Active (04/12/2021)  Stress: No Stress Concern Present (07/10/2020)  Recent Concern: Stress - Stress Concern Present (06/14/2020)  Tobacco Use: Low Risk  (09/30/2023)  Health Literacy: Adequate Health Literacy (09/30/2023)    Krystal Bauer, MSW, LCSW Clinical Social Worker II [facility] Heart/Vascular Care Navigation  [phone]- work cell phone (preferred)

## 2023-12-04 ENCOUNTER — Other Ambulatory Visit: Payer: Self-pay

## 2023-12-04 NOTE — Telephone Encounter (Signed)
 Pt's pharmacy is requesting a refill on non cardiac medication escitalopram . Would Dr. Raford like to refill this non cardiac medication? Please address

## 2023-12-05 MED ORDER — ESCITALOPRAM OXALATE 10 MG PO TABS: 10.0000 mg | ORAL_TABLET | Freq: Every day | ORAL | 2 refills | Status: DC

## 2024-03-08 ENCOUNTER — Encounter (HOSPITAL_BASED_OUTPATIENT_CLINIC_OR_DEPARTMENT_OTHER): Admitting: Cardiovascular Disease

## 2024-03-08 NOTE — Progress Notes (Deleted)
 Advanced Hypertension Clinic Follow-up:    Date:  03/08/2024   ID:  Krystal Bauer, DOB 06/23/1970, MRN [id number]  PCP:  Rexanne Ingle, MD  Cardiologist:  None  Nephrologist:  Referring MD: Rexanne Ingle, MD   CC: Hypertension  History of Present Illness:    Krystal Bauer is a 53 y.o. female with a hx of hypertension, hyperlipidemia, mild renal artery stenosis, PCOS and fibroids here follow-up. She initially established care in the [facility] Clinic 04/17/2020. April 2021 she had a uterine fibroid procedure.  Following the procedure she struggled with headaches.  She checked her BP and it was 190s/100s.  She went to the ED and it was 200/118.  She was started on HCTZ and losartan  was subsequently added. She was referred to [facility]. Renal artery dopplers 04/2020 revealed mild blockage on the right and none on the left. Renin and aldosterone were within normal limits. Thyroid  function was normal. Losartan /HCTZ was increased, however she stopped taking it due to a rash. When she saw our pharmacist, she was not taking any medicines and was started on amlodipine . She was also started on rosuvastatin  for her lipids.  Krystal Bauer worked on her diet and was following a chief technology officer.  Her blood pressures were well controlled.  She had a coronary calcium  score 04/2021 with a score of 0.  Repeat renal artery Dopplers 05/2021 showed mild blockage on the right and none on the left.  At her appointment 09/2021 blood pressure was well-controlled on valsartan /HCTZ.  She was bradycardic and fatigued.  Thyroid  functioning was normal.    At her visit 05/2023 she was adjusting her medications and taking half doses in order to avoid hypotension.  Blood pressure in the office was 152/88.  She noted significant flexion ablation and her blood pressure was stressed.  She felt skin peeling and dryness on valsartan /HCTZ.  The valsartan  dose was reduced, as was the hydrochlorothiazide .   She was also started on Lexapro  for stress and anxiety.  Discussed the use of AI scribe software for clinical note transcription with the patient, who gave verbal consent to proceed.  History of Present Illness At her visit 09/2023 Krystal Bauer's BP was better controlled. She had life stressors but was working on lifestyle modification and anxiety management.   Past Medical History:  Diagnosis Date   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   Complication of anesthesia    pt feels she has prolonged effects of anes, lethargy and feels 'woozey   Essential hypertension 04/17/2020   History of PCOS    Medical history non-contributory    Ovarian cyst    Situational depression 06/02/2023    Past Surgical History:  Procedure Laterality Date   DILATATION & CURRETTAGE/HYSTEROSCOPY WITH RESECTOCOPE N/A 12/03/2012   Procedure: DILATATION & CURETTAGE/HYSTEROSCOPY WITH RESECTION OF ENDOMETRIAL POLYP and fibroid;  Surgeon: Rome Rigg, MD;  Location: [facility];  Service: Gynecology;  Laterality: N/A;   DILATION AND CURETTAGE OF UTERUS     x3   IR ANGIOGRAM PELVIS SELECTIVE OR SUPRASELECTIVE  07/12/2019   IR ANGIOGRAM PELVIS SELECTIVE OR SUPRASELECTIVE  07/12/2019   IR ANGIOGRAM SELECTIVE EACH ADDITIONAL VESSEL  07/12/2019   IR ANGIOGRAM SELECTIVE EACH ADDITIONAL VESSEL  07/12/2019   IR EMBO TUMOR ORGAN ISCHEMIA INFARCT INC GUIDE ROADMAPPING  07/12/2019   IR RADIOLOGIST EVAL & MGMT  04/22/2019   IR RADIOLOGIST EVAL & MGMT  08/10/2019   IR RADIOLOGIST EVAL & MGMT  02/16/2020   IR US  GUIDE VASC ACCESS RIGHT  07/12/2019  OVARIAN CYST SURGERY     WISDOM TOOTH EXTRACTION      Current Medications: No outpatient medications have been marked as taking for the 03/08/24 encounter (Appointment) with Raford Riggs, MD.     Allergies:   Hyzaar [losartan  potassium-hctz], Wheat, Crestor  [rosuvastatin ], Latex, Sulfa antibiotics, and Sulfamethoxazole-trimethoprim   Social History   Socioeconomic History   Marital status:  Single    Spouse name: Not on file   Number of children: Not on file   Years of education: Not on file   Highest education level: Not on file  Occupational History   Not on file  Tobacco Use   Smoking status: Never   Smokeless tobacco: Never  Vaping Use   Vaping status: Never Used  Substance and Sexual Activity   Alcohol use: Yes    Comment: socially    Drug use: No   Sexual activity: Not Currently    Birth control/protection: Abstinence  Other Topics Concern   Not on file  Social History Narrative   Not on file   Social Drivers of Health   Financial Resource Strain: Medium Risk (09/30/2023)   Overall Financial Resource Strain (CARDIA)    Difficulty of Paying Living Expenses: Somewhat hard  Food Insecurity: No Food Insecurity (09/30/2023)   Hunger Vital Sign    Worried About Running Out of Food in the Last Year: Never true    Ran Out of Food in the Last Year: Never true  Transportation Needs: No Transportation Needs (09/30/2023)   PRAPARE - Administrator, Civil Service (Medical): No    Lack of Transportation (Non-Medical): No  Physical Activity: Sufficiently Active (04/12/2021)   Exercise Vital Sign    Days of Exercise per Week: 3 days    Minutes of Exercise per Session: 60 min  Stress: No Stress Concern Present (07/10/2020)   [employer] of Occupational Health - Occupational Stress Questionnaire    Feeling of Stress : Not at all  Recent Concern: Stress - Stress Concern Present (06/14/2020)   [employer] of Occupational Health - Occupational Stress Questionnaire    Feeling of Stress : To some extent  Social Connections: Not on file     Family History: The patient's family history includes Asthma in her mother; Diabetes in her father and mother; Heart Problems in her father; Hypertension in her father and mother; Parkinson's disease in her mother; Prostate cancer in her brother.  ROS:   Please see the history of present illness.    (+) Stress (+)  Skin rash All other systems reviewed and are negative.  EKGs/Labs/Other Studies Reviewed:    Bilateral Renal Artery Duplex 05/08/2020: Summary:  Largest Aortic Diameter: 2.1 cm     Renal:     Right: Normal size right kidney. Normal right Resistive Index.         Normal cortical thickness of right kidney. 1-59% stenosis of         the right renal artery. RRV flow present.  Left:  Normal size of left kidney. Normal left Resistive Index.         Normal cortical thickness of the left kidney. No evidence of         left renal artery stenosis. LRV flow present.  Mesenteric:  Normal Celiac artery and Superior Mesenteric artery findings.     Patent IVC.   EKG:   09/14/2021: Sinus bradycardia.  Rate 49 bpm. 04/17/2020: sinus rhythm.  Rate 71 bpm.   Recent Labs: No results found for  requested labs within last 365 days.   Recent Lipid Panel    Component Value Date/Time   CHOL 195 05/09/2021 1143   TRIG 53 05/09/2021 1143   HDL 63 05/09/2021 1143   CHOLHDL 3.1 05/09/2021 1143   CHOLHDL 3.5 02/24/2012 1027   VLDL 13 02/24/2012 1027   LDLCALC 122 (H) 05/09/2021 1143    Physical Exam:    VS:  There were no vitals taken for this visit. , BMI There is no height or weight on file to calculate BMI. GENERAL:  Well appearing HEENT: Pupils equal round and reactive, fundi not visualized, oral mucosa unremarkable NECK:  No jugular venous distention, waveform within normal limits, carotid upstroke brisk and symmetric, no bruits LUNGS:  Clear to auscultation bilaterally HEART:  RRR.  PMI not displaced or sustained,S1 and S2 within normal limits, no S3, no S4, no clicks, no rubs, no murmurs ABD:  Flat, positive bowel sounds normal in frequency in pitch, no bruits, no rebound, no guarding, no midline pulsatile mass, no hepatomegaly, no splenomegaly EXT:  2 plus pulses throughout, no edema, no cyanosis no clubbing SKIN:  No rashes no nodules NEURO:  Cranial nerves II through XII grossly intact,  motor grossly intact throughout PSYCH:  Cognitively intact, oriented to person place and time  ASSESSMENT/PLAN:    No diagnosis found.  Assessment & Plan    Disposition:    FU with Savanah Bayles C. Raford, MD, [facility] in 6 months   Medication Adjustments/Labs and Tests Ordered: Current medicines are reviewed at length with the patient today.  Concerns regarding medicines are outlined above.   No orders of the defined types were placed in this encounter.  No orders of the defined types were placed in this encounter.  Signed, Annabella Raford, MD  03/08/2024 1:20 PM    [facility]

## 2024-03-25 ENCOUNTER — Other Ambulatory Visit: Payer: Self-pay | Admitting: Cardiovascular Disease

## 2024-03-29 MED ORDER — ESCITALOPRAM OXALATE 10 MG PO TABS: 10.0000 mg | ORAL_TABLET | Freq: Every day | ORAL | 2 refills | Status: AC
